# Patient Record
Sex: Female | Born: 1980 | Race: Black or African American | Hispanic: No | Marital: Married | Smoking: Never smoker
Health system: Southern US, Community
[De-identification: ages and names within clinical notes are randomized; demographics above are authoritative.]

## PROBLEM LIST (undated history)

## (undated) DIAGNOSIS — D573 Sickle-cell trait: Secondary | ICD-10-CM

## (undated) DIAGNOSIS — D649 Anemia, unspecified: Secondary | ICD-10-CM

---

## 2009-05-21 ENCOUNTER — Emergency Department (HOSPITAL_COMMUNITY): Admission: EM | Admit: 2009-05-21 | Discharge: 2009-05-21 | Payer: Self-pay | Admitting: Emergency Medicine

## 2009-06-19 ENCOUNTER — Emergency Department (HOSPITAL_BASED_OUTPATIENT_CLINIC_OR_DEPARTMENT_OTHER): Admission: EM | Admit: 2009-06-19 | Discharge: 2009-06-19 | Payer: Self-pay | Admitting: Emergency Medicine

## 2010-01-03 ENCOUNTER — Emergency Department (HOSPITAL_BASED_OUTPATIENT_CLINIC_OR_DEPARTMENT_OTHER): Admission: EM | Admit: 2010-01-03 | Discharge: 2010-01-03 | Payer: Self-pay | Admitting: Emergency Medicine

## 2010-01-03 ENCOUNTER — Ambulatory Visit: Payer: Self-pay | Admitting: Diagnostic Radiology

## 2010-04-16 ENCOUNTER — Ambulatory Visit: Payer: Self-pay | Admitting: Radiology

## 2010-04-16 ENCOUNTER — Ambulatory Visit (HOSPITAL_BASED_OUTPATIENT_CLINIC_OR_DEPARTMENT_OTHER)
Admission: RE | Admit: 2010-04-16 | Discharge: 2010-04-16 | Payer: Self-pay | Source: Home / Self Care | Admitting: Emergency Medicine

## 2010-04-16 ENCOUNTER — Emergency Department (HOSPITAL_BASED_OUTPATIENT_CLINIC_OR_DEPARTMENT_OTHER): Admission: EM | Admit: 2010-04-16 | Discharge: 2010-04-16 | Payer: Self-pay | Admitting: Emergency Medicine

## 2010-07-06 ENCOUNTER — Other Ambulatory Visit (HOSPITAL_COMMUNITY): Payer: Self-pay | Admitting: *Deleted

## 2010-07-06 DIAGNOSIS — Z3682 Encounter for antenatal screening for nuchal translucency: Secondary | ICD-10-CM

## 2010-07-27 ENCOUNTER — Other Ambulatory Visit (HOSPITAL_COMMUNITY): Payer: BC Managed Care – PPO

## 2010-07-27 ENCOUNTER — Other Ambulatory Visit (HOSPITAL_COMMUNITY): Payer: Self-pay

## 2010-07-31 LAB — WET PREP, GENITAL
Clue Cells Wet Prep HPF POC: NONE SEEN
Trich, Wet Prep: NONE SEEN

## 2010-07-31 LAB — URINALYSIS, ROUTINE W REFLEX MICROSCOPIC
Glucose, UA: NEGATIVE mg/dL
Ketones, ur: NEGATIVE mg/dL
Leukocytes, UA: NEGATIVE
Protein, ur: NEGATIVE mg/dL
Urobilinogen, UA: 0.2 mg/dL (ref 0.0–1.0)

## 2010-07-31 LAB — URINE MICROSCOPIC-ADD ON

## 2010-07-31 LAB — PREGNANCY, URINE: Preg Test, Ur: NEGATIVE

## 2010-08-03 ENCOUNTER — Ambulatory Visit (HOSPITAL_COMMUNITY)
Admission: RE | Admit: 2010-08-03 | Discharge: 2010-08-03 | Disposition: A | Discharge status: Home / Self Care | Payer: BC Managed Care – PPO | Source: Ambulatory Visit | Attending: *Deleted | Admitting: *Deleted

## 2010-08-03 ENCOUNTER — Ambulatory Visit (HOSPITAL_COMMUNITY)
Admission: RE | Admit: 2010-08-03 | Discharge: 2010-08-03 | Disposition: A | Discharge status: Home / Self Care | Payer: BC Managed Care – PPO | Source: Ambulatory Visit | Attending: Obstetrics and Gynecology | Admitting: Obstetrics and Gynecology

## 2010-08-03 DIAGNOSIS — O3510X0 Maternal care for (suspected) chromosomal abnormality in fetus, unspecified, not applicable or unspecified: Secondary | ICD-10-CM | POA: Insufficient documentation

## 2010-08-03 DIAGNOSIS — O351XX0 Maternal care for (suspected) chromosomal abnormality in fetus, not applicable or unspecified: Secondary | ICD-10-CM | POA: Insufficient documentation

## 2010-08-03 DIAGNOSIS — Z3682 Encounter for antenatal screening for nuchal translucency: Secondary | ICD-10-CM

## 2010-08-03 DIAGNOSIS — Z3689 Encounter for other specified antenatal screening: Secondary | ICD-10-CM | POA: Insufficient documentation

## 2010-08-05 LAB — CBC
MCHC: 33.1 g/dL (ref 30.0–36.0)
RBC: 4.41 MIL/uL (ref 3.87–5.11)
RDW: 12.7 % (ref 11.5–15.5)

## 2010-08-05 LAB — DIFFERENTIAL
Basophils Absolute: 0.1 10*3/uL (ref 0.0–0.1)
Basophils Relative: 2 % — ABNORMAL HIGH (ref 0–1)
Neutro Abs: 2.9 10*3/uL (ref 1.7–7.7)
Neutrophils Relative %: 60 % (ref 43–77)

## 2010-08-05 LAB — BASIC METABOLIC PANEL
CO2: 24 mEq/L (ref 19–32)
Calcium: 9.3 mg/dL (ref 8.4–10.5)
Creatinine, Ser: 0.62 mg/dL (ref 0.4–1.2)
GFR calc Af Amer: >60 mL/min (ref >60)
GFR calc non Af Amer: >60 mL/min (ref >60)
Glucose, Bld: 99 mg/dL (ref 70–99)

## 2010-08-05 LAB — POCT CARDIAC MARKERS
CKMB, poc: <1 ng/mL — ABNORMAL LOW (ref 1.0–8.0)
Myoglobin, poc: 43.6 ng/mL (ref 12–200)

## 2010-08-21 ENCOUNTER — Other Ambulatory Visit (HOSPITAL_COMMUNITY): Payer: Self-pay | Admitting: Obstetrics and Gynecology

## 2010-08-21 DIAGNOSIS — Z0489 Encounter for examination and observation for other specified reasons: Secondary | ICD-10-CM

## 2010-08-21 DIAGNOSIS — IMO0002 Reserved for concepts with insufficient information to code with codable children: Secondary | ICD-10-CM

## 2010-09-12 ENCOUNTER — Ambulatory Visit (HOSPITAL_COMMUNITY)
Admission: RE | Admit: 2010-09-12 | Discharge: 2010-09-12 | Disposition: A | Discharge status: Home / Self Care | Payer: BC Managed Care – PPO | Source: Ambulatory Visit | Attending: Obstetrics and Gynecology | Admitting: Obstetrics and Gynecology

## 2010-09-12 DIAGNOSIS — O3410 Maternal care for benign tumor of corpus uteri, unspecified trimester: Secondary | ICD-10-CM | POA: Insufficient documentation

## 2010-09-12 DIAGNOSIS — Z1389 Encounter for screening for other disorder: Secondary | ICD-10-CM | POA: Insufficient documentation

## 2010-09-12 DIAGNOSIS — Z363 Encounter for antenatal screening for malformations: Secondary | ICD-10-CM | POA: Insufficient documentation

## 2010-09-12 DIAGNOSIS — O358XX0 Maternal care for other (suspected) fetal abnormality and damage, not applicable or unspecified: Secondary | ICD-10-CM | POA: Insufficient documentation

## 2010-09-12 DIAGNOSIS — Z0489 Encounter for examination and observation for other specified reasons: Secondary | ICD-10-CM

## 2010-09-27 ENCOUNTER — Other Ambulatory Visit (HOSPITAL_COMMUNITY): Payer: Self-pay | Admitting: Obstetrics and Gynecology

## 2010-09-27 ENCOUNTER — Other Ambulatory Visit (HOSPITAL_BASED_OUTPATIENT_CLINIC_OR_DEPARTMENT_OTHER): Payer: Self-pay | Admitting: Obstetrics and Gynecology

## 2010-10-01 ENCOUNTER — Other Ambulatory Visit (HOSPITAL_COMMUNITY): Payer: Self-pay | Admitting: *Deleted

## 2010-10-01 ENCOUNTER — Other Ambulatory Visit (HOSPITAL_COMMUNITY): Payer: Self-pay | Admitting: Obstetrics and Gynecology

## 2010-10-01 DIAGNOSIS — Z0489 Encounter for examination and observation for other specified reasons: Secondary | ICD-10-CM

## 2012-02-19 ENCOUNTER — Encounter (HOSPITAL_BASED_OUTPATIENT_CLINIC_OR_DEPARTMENT_OTHER): Payer: Self-pay | Admitting: *Deleted

## 2012-02-19 ENCOUNTER — Emergency Department (HOSPITAL_BASED_OUTPATIENT_CLINIC_OR_DEPARTMENT_OTHER)
Admission: EM | Admit: 2012-02-19 | Discharge: 2012-02-19 | Disposition: A | Discharge status: Home / Self Care | Payer: Medicaid Other | Attending: Emergency Medicine | Admitting: Emergency Medicine

## 2012-02-19 ENCOUNTER — Emergency Department (HOSPITAL_BASED_OUTPATIENT_CLINIC_OR_DEPARTMENT_OTHER): Payer: Medicaid Other

## 2012-02-19 DIAGNOSIS — Z3490 Encounter for supervision of normal pregnancy, unspecified, unspecified trimester: Secondary | ICD-10-CM

## 2012-02-19 DIAGNOSIS — O209 Hemorrhage in early pregnancy, unspecified: Secondary | ICD-10-CM | POA: Insufficient documentation

## 2012-02-19 DIAGNOSIS — D509 Iron deficiency anemia, unspecified: Secondary | ICD-10-CM | POA: Insufficient documentation

## 2012-02-19 LAB — CBC WITH DIFFERENTIAL/PLATELET
Basophils Absolute: 0 10*3/uL (ref 0.0–0.1)
Eosinophils Absolute: 0.1 10*3/uL (ref 0.0–0.7)
Eosinophils Relative: 1 % (ref 0–5)
Lymphs Abs: 2.6 10*3/uL (ref 0.7–4.0)
MCH: 28.1 pg (ref 26.0–34.0)
MCHC: 36 g/dL (ref 30.0–36.0)
MCV: 77.9 fL — ABNORMAL LOW (ref 78.0–100.0)
Platelets: 195 10*3/uL (ref 150–400)
RDW: 12.3 % (ref 11.5–15.5)

## 2012-02-19 LAB — WET PREP, GENITAL

## 2012-02-19 LAB — URINE MICROSCOPIC-ADD ON

## 2012-02-19 LAB — URINALYSIS, ROUTINE W REFLEX MICROSCOPIC
Glucose, UA: NEGATIVE mg/dL
Ketones, ur: NEGATIVE mg/dL
Leukocytes, UA: NEGATIVE
Protein, ur: NEGATIVE mg/dL

## 2012-02-19 LAB — BASIC METABOLIC PANEL
Calcium: 8.9 mg/dL (ref 8.4–10.5)
GFR calc non Af Amer: >90 mL/min (ref >90)
Glucose, Bld: 90 mg/dL (ref 70–99)
Sodium: 134 mEq/L — ABNORMAL LOW (ref 135–145)

## 2012-02-19 MED ORDER — FOLIC ACID 800 MCG PO TABS: 800.0000 ug | ORAL_TABLET | Freq: Every day | ORAL | Status: DC

## 2012-02-19 NOTE — ED Notes (Signed)
Pt states 2 months preg with bright red spotting x 30 mins

## 2012-02-19 NOTE — ED Notes (Signed)
PA at bedside.

## 2012-02-19 NOTE — ED Notes (Signed)
Tia O, PA-C at bedside speaking to pt.

## 2012-02-19 NOTE — ED Notes (Signed)
Patient transported to Ultrasound 

## 2012-02-19 NOTE — ED Provider Notes (Signed)
History     CSN: [id number]  Arrival date & time 02/19/12  1554   First MD Initiated Contact with Patient 02/19/12 1610      Chief Complaint  Patient presents with  . Vaginal Bleeding    (Consider location/radiation/quality/duration/timing/severity/associated sxs/prior treatment) HPI Comments: Patient G2P1A0 presents with estimated [redacted] weeks gestation with first trimester bleeding. Patient states that around 3:30 she noticed some bright red blood blood on the toilet paper after she went to the bathroom. Patient states that there has been no spotting since. She has not had to use a pad. LMP was in early September. Patient has been taking prenatal vitamins and is receiving prenatal care [facility]. An ultrasound has not been performed.  Associated symptoms include mild back pain 5/10. Denies fever or chills. Denies NVD or abdominal pain. Denies dysuria, frequency, or urgency. Denies vaginal discharge, pelvic pain, or odor.      The history is provided by the patient. No language interpreter was used.    History reviewed. No pertinent past medical history.  History reviewed. No pertinent past surgical history.  History reviewed. No pertinent family history.  History  Substance Use Topics  . Smoking status: Never Smoker   . Smokeless tobacco: Not on file  . Alcohol Use: No    OB History    Grav Para Term Preterm Abortions TAB SAB Ect Mult Living                  Review of Systems  Constitutional: Negative for fever and chills.  HENT: Negative for neck pain.   Cardiovascular: Negative for leg swelling.  Gastrointestinal: Negative for nausea, vomiting, abdominal pain and diarrhea.       No incontinence of bowel  Genitourinary: Positive for vaginal bleeding. Negative for dysuria, urgency, vaginal discharge, difficulty urinating and vaginal pain.       No incontinence or retention  Musculoskeletal: Positive for back pain.  Skin: Negative for rash.    Neurological: Negative for weakness and numbness.    Allergies  Review of patient's allergies indicates no known allergies.  Home Medications   Current Outpatient Rx  Name Route Sig Dispense Refill  . PRENATAL MULTIVITAMIN CH Oral Take 1 tablet by mouth daily.      BP 128/65  Pulse 72  Temp 99 F (37.2 C) (Oral)  Resp 18  Ht 5\' 5"  (1.651 m)  Wt 175 lb (79.379 kg)  BMI 29.12 kg/m2  SpO2 100%  LMP 12/20/2011  Physical Exam  Nursing note and vitals reviewed. Constitutional: She appears well-developed and well-nourished.  HENT:  Head: Normocephalic and atraumatic.  Mouth/Throat: Oropharynx is clear and moist.  Eyes: Conjunctivae normal and EOM are normal.  Neck: Normal range of motion. Neck supple.  Cardiovascular: Normal rate, regular rhythm and normal heart sounds.   Pulmonary/Chest: Effort normal and breath sounds normal.  Abdominal: Soft. Bowel sounds are normal.  Genitourinary: Uterus normal. Cervix exhibits no motion tenderness, no discharge and no friability.    Neurological: She is alert.  Skin: Skin is warm and dry.    ED Course  Procedures (including critical care time)   Labs Reviewed  CBC WITH DIFFERENTIAL  BASIC METABOLIC PANEL  ABO/RH  URINALYSIS, ROUTINE W REFLEX MICROSCOPIC  WET PREP, GENITAL  GC/CHLAMYDIA PROBE AMP, GENITAL   Results for orders placed during the hospital encounter of 02/19/12  CBC WITH DIFFERENTIAL      Component Value Range   WBC 7.4  4.0 - 10.5 K/uL  RBC 3.99  3.87 - 5.11 MIL/uL   Hemoglobin 11.2 (*) 12.0 - 15.0 g/dL   HCT 16.1 (*) 09.6 - 04.5 %   MCV 77.9 (*) 78.0 - 100.0 fL   MCH 28.1  26.0 - 34.0 pg   MCHC 36.0  30.0 - 36.0 g/dL   RDW 40.9  81.1 - 91.4 %   Platelets 195  150 - 400 K/uL   Neutrophils Relative 54  43 - 77 %   Neutro Abs 4.0  1.7 - 7.7 K/uL   Lymphocytes Relative 36  12 - 46 %   Lymphs Abs 2.6  0.7 - 4.0 K/uL   Monocytes Relative 9  3 - 12 %   Monocytes Absolute 0.7  0.1 - 1.0 K/uL    Eosinophils Relative 1  0 - 5 %   Eosinophils Absolute 0.1  0.0 - 0.7 K/uL   Basophils Relative 0  0 - 1 %   Basophils Absolute 0.0  0.0 - 0.1 K/uL  BASIC METABOLIC PANEL      Component Value Range   Sodium 134 (*) 135 - 145 mEq/L   Potassium 3.5  3.5 - 5.1 mEq/L   Chloride 101  96 - 112 mEq/L   CO2 22  19 - 32 mEq/L   Glucose, Bld 90  70 - 99 mg/dL   BUN 10  6 - 23 mg/dL   Creatinine, Ser 7.82  0.50 - 1.10 mg/dL   Calcium 8.9  8.4 - 95.6 mg/dL   GFR calc non Af Amer >90  >90 mL/min   GFR calc Af Amer >90  >90 mL/min  URINALYSIS, ROUTINE W REFLEX MICROSCOPIC      Component Value Range   Color, Urine YELLOW  YELLOW   APPearance CLEAR  CLEAR   Specific Gravity, Urine 1.012  1.005 - 1.030   pH 7.0  5.0 - 8.0   Glucose, UA NEGATIVE  NEGATIVE mg/dL   Hgb urine dipstick LARGE (*) NEGATIVE   Bilirubin Urine NEGATIVE  NEGATIVE   Ketones, ur NEGATIVE  NEGATIVE mg/dL   Protein, ur NEGATIVE  NEGATIVE mg/dL   Urobilinogen, UA 0.2  0.0 - 1.0 mg/dL   Nitrite NEGATIVE  NEGATIVE   Leukocytes, UA NEGATIVE  NEGATIVE  WET PREP, GENITAL      Component Value Range   Yeast Wet Prep HPF POC NONE SEEN  NONE SEEN   Trich, Wet Prep NONE SEEN  NONE SEEN   Clue Cells Wet Prep HPF POC NONE SEEN  NONE SEEN   WBC, Wet Prep HPF POC RARE (*) NONE SEEN  URINE MICROSCOPIC-ADD ON      Component Value Range   Squamous Epithelial / LPF RARE  RARE   WBC, UA 0-2  <3 WBC/hpf   RBC / HPF 0-2  <3 RBC/hpf   Bacteria, UA FEW (*) RARE   Urine-Other MUCOUS PRESENT      US Ob Comp Less 14 Wks  02/19/2012  *RADIOLOGY REPORT*  Clinical Data: 31 year old pregnant female with vaginal bleeding and pain.  Estimated gestation of 8 weeks 5 days by LMP.  OBSTETRIC <14 WK [place] AND TRANSVAGINAL OB US  Technique:  Both transabdominal and transvaginal ultrasound examinations were performed for complete evaluation of the gestation as well as the maternal uterus, adnexal regions, and pelvic cul-de-sac.  Transvaginal technique was  performed to assess early pregnancy.  Comparison:  None.  Intrauterine gestational sac:  Visualized/normal in shape. Yolk sac: Present Embryo: Present Cardiac Activity: Present Heart Rate: 168  bpm  CRL: 32.9  mm  10 w  1 d            [place] EDC: 09/15/2012  Maternal uterus/adnexae: There is no evidence of subchorionic hemorrhage. The ovaries bilaterally are unremarkable. There is no evidence of free fluid or adnexal mass.  IMPRESSION: Single living intrauterine gestation with estimated gestational age of [redacted] weeks 1 day by this ultrasound.  No evidence of subchorionic hemorrhage.   Original Report Authenticated By: Rosendo Gros, M.D.    US Ob Comp Less 14 Wks  02/19/2012  *RADIOLOGY REPORT*  Clinical Data: 31 year old pregnant female with vaginal bleeding and pain.  Estimated gestation of 8 weeks 5 days by LMP.  OBSTETRIC <14 WK [place] AND TRANSVAGINAL OB US  Technique:  Both transabdominal and transvaginal ultrasound examinations were performed for complete evaluation of the gestation as well as the maternal uterus, adnexal regions, and pelvic cul-de-sac.  Transvaginal technique was performed to assess early pregnancy.  Comparison:  None.  Intrauterine gestational sac:  Visualized/normal in shape. Yolk sac: Present Embryo: Present Cardiac Activity: Present Heart Rate: 168 bpm  CRL: 32.9  mm  10 w  1 d            [place] EDC: 09/15/2012  Maternal uterus/adnexae: There is no evidence of subchorionic hemorrhage. The ovaries bilaterally are unremarkable. There is no evidence of free fluid or adnexal mass.  IMPRESSION: Single living intrauterine gestation with estimated gestational age of [redacted] weeks 1 day by this ultrasound.  No evidence of subchorionic hemorrhage.   Original Report Authenticated By: Rosendo Gros, M.D.      1. First trimester bleeding   2. Pregnancy   3. Microcytic anemia       MDM  Patient G2P1A0, with adequate prenatal care, presented with one episode of first trimester bleeding. Cervical os closed  on pelvic. No CMT. Wet prep, UA, BMP: unremarkable. ABO/Rh: B pos CBC: remarkable for microcytic anemia. US Ob complete & US transvaginal: remarkable for viable pregnancy, [redacted] weeks gestation. Patient will follow-up with OB/GYN at local health department. Patient discharged with Rx for supplemental folic acid, as she has not been taking folic acid which conflicts with current USPTF guidelines. Patient also informed of anemia and encouraged to follow-up with OB/GYN for recheck and supplemental iron if needed. Return precautions given verbally and in discharge summary. No red flags for missed or threatened abortion, as patient has closed cervical os, has not expelled products of conception, and viable pregnancy confirmed by imaging.        Pixie Casino, PA-C 02/19/12 2014

## 2012-02-20 LAB — GC/CHLAMYDIA PROBE AMP, GENITAL
Chlamydia, DNA Probe: NEGATIVE
GC Probe Amp, Genital: NEGATIVE

## 2012-02-25 NOTE — ED Provider Notes (Signed)
Medical screening examination/treatment/procedure(s) were performed by non-physician practitioner and as supervising physician I was immediately available for consultation/collaboration.  Jones Skene, M.D.     Jones Skene, MD 02/25/12 [phone]

## 2012-06-11 ENCOUNTER — Emergency Department (HOSPITAL_BASED_OUTPATIENT_CLINIC_OR_DEPARTMENT_OTHER)
Admission: EM | Admit: 2012-06-11 | Discharge: 2012-06-11 | Disposition: A | Discharge status: Home / Self Care | Payer: 59 | Attending: Emergency Medicine | Admitting: Emergency Medicine

## 2012-06-11 ENCOUNTER — Encounter (HOSPITAL_BASED_OUTPATIENT_CLINIC_OR_DEPARTMENT_OTHER): Payer: Self-pay | Admitting: *Deleted

## 2012-06-11 DIAGNOSIS — J069 Acute upper respiratory infection, unspecified: Secondary | ICD-10-CM | POA: Insufficient documentation

## 2012-06-11 DIAGNOSIS — O9989 Other specified diseases and conditions complicating pregnancy, childbirth and the puerperium: Secondary | ICD-10-CM | POA: Insufficient documentation

## 2012-06-11 DIAGNOSIS — R51 Headache: Secondary | ICD-10-CM | POA: Insufficient documentation

## 2012-06-11 NOTE — ED Provider Notes (Signed)
Medical screening examination/treatment/procedure(s) were performed by non-physician practitioner and as supervising physician I was immediately available for consultation/collaboration.   Shaquayla Klimas, MD 06/11/12 2334 

## 2012-06-11 NOTE — ED Notes (Signed)
Reports ob Md suggested pt come to ed to be evaluated, pt is 6 month pregnant, last  Saw ob Md 1 week ago, baby moving as expectant, mother reports no decrease in activity

## 2012-06-11 NOTE — ED Provider Notes (Signed)
History     CSN: [id number]  Arrival date & time 06/11/12  2013   First MD Initiated Contact with Patient 06/11/12 2104      Chief Complaint  Patient presents with  . URI    (Consider location/radiation/quality/duration/timing/severity/associated sxs/prior treatment) Patient is a 32 y.o. female presenting with URI. The history is provided by the patient. No language interpreter was used.  URI The primary symptoms include headaches and cough. Primary symptoms do not include fever. The current episode started 6 to 7 days ago. This is a new problem. The problem has not changed since onset. Symptoms associated with the illness include congestion.    History reviewed. No pertinent past medical history.  History reviewed. No pertinent past surgical history.  History reviewed. No pertinent family history.  History  Substance Use Topics  . Smoking status: Never Smoker   . Smokeless tobacco: Not on file  . Alcohol Use: No    OB History    Grav Para Term Preterm Abortions TAB SAB Ect Mult Living                  Review of Systems  Constitutional: Negative for fever.  HENT: Positive for congestion.   Respiratory: Positive for cough.   Neurological: Positive for headaches.    Allergies  Review of patient's allergies indicates no known allergies.  Home Medications   Current Outpatient Rx  Name  Route  Sig  Dispense  Refill  . FOLIC ACID 800 MCG PO TABS   Oral   Take 1 tablet (800 mcg total) by mouth daily.   30 tablet   2   . PRENATAL MULTIVITAMIN CH   Oral   Take 1 tablet by mouth daily.           BP 123/59  Pulse 76  Temp 97.9 F (36.6 C) (Oral)  Resp 16  Ht 5\' 5"  (1.651 m)  Wt 180 lb (81.647 kg)  BMI 29.95 kg/m2  SpO2 100%  LMP 01/10/2012  Physical Exam  Nursing note and vitals reviewed. Constitutional: She appears well-developed and well-nourished.  HENT:  Head: Normocephalic and atraumatic.  Right Ear: External ear normal.  Left Ear:  External ear normal.  Nose: Rhinorrhea present.  Mouth/Throat: Posterior oropharyngeal erythema present.  Eyes: Conjunctivae normal and EOM are normal.  Cardiovascular: Regular rhythm.   Pulmonary/Chest: Effort normal and breath sounds normal.  Musculoskeletal: Normal range of motion.    ED Course  Procedures (including critical care time)  Labs Reviewed - No data to display No results found.   1. URI (upper respiratory infection)       MDM  Pt instructed on otc medication:pt has not had fever:doubt flu        Teressa Lower, NP 06/11/12 2207

## 2012-06-11 NOTE — ED Notes (Signed)
Pt c/o URI symptoms x 1 week pt is 6 months preg

## 2013-08-29 IMAGING — US US OB COMP LESS 14 WK
1 series · 14 of 28 positions shown · non-contrast
Comparison: None.

CLINICAL DATA: 31-year-old pregnant female with vaginal bleeding
and pain.  Estimated gestation of 8 weeks 5 days by LMP.

OBSTETRIC <14 WK US AND TRANSVAGINAL OB US
TECHNIQUE: Both transabdominal and transvaginal ultrasound
examinations were performed for complete evaluation of the
gestation as well as the maternal uterus, adnexal regions, and
pelvic cul-de-sac.  Transvaginal technique was performed to assess
early pregnancy.

[Series 1: us ob comp less 14 wk · 0.26mm/px · 14 of 53 slices shown]
[im 2/53]
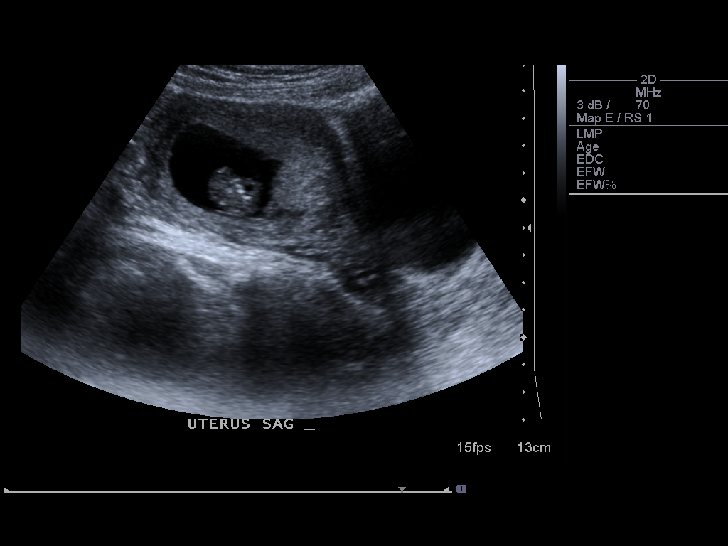
[im 6/53]
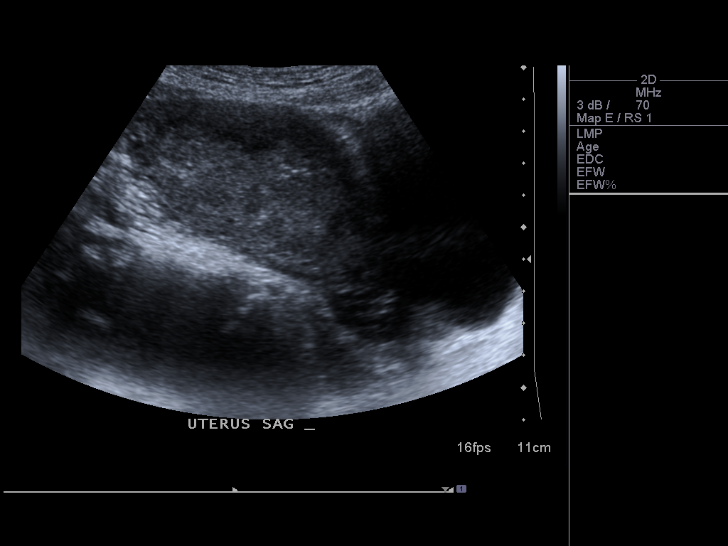
[im 10/53]
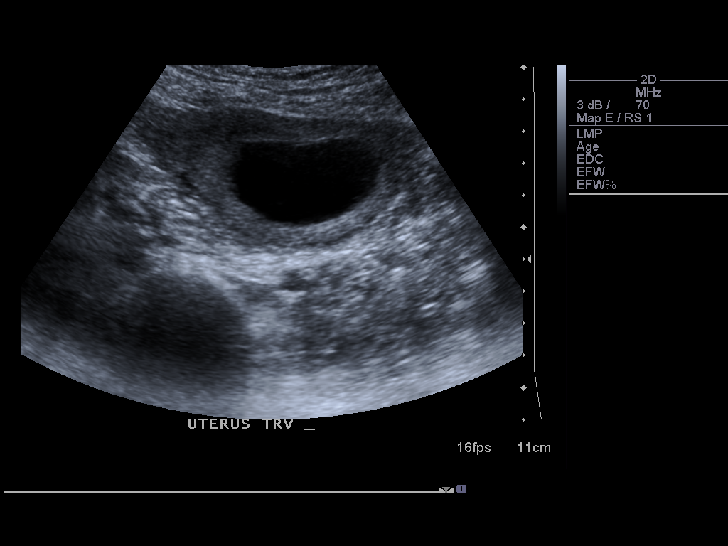
[im 14/53]
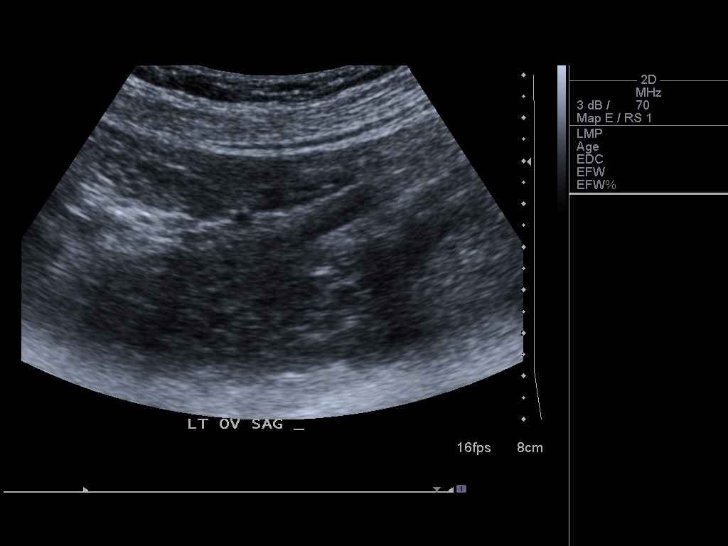
[im 18/53]
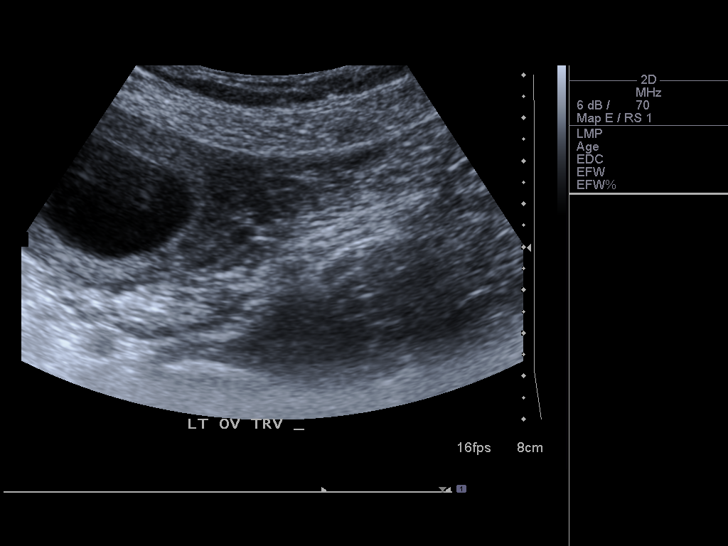
[im 22/53]
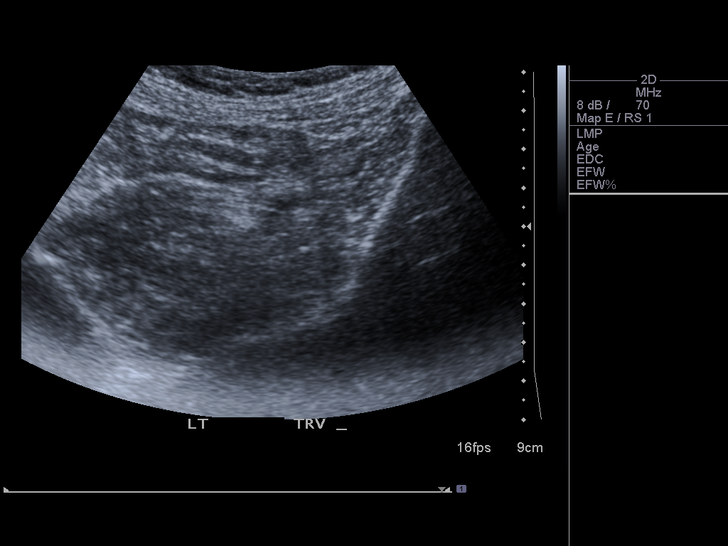
[im 26/53]
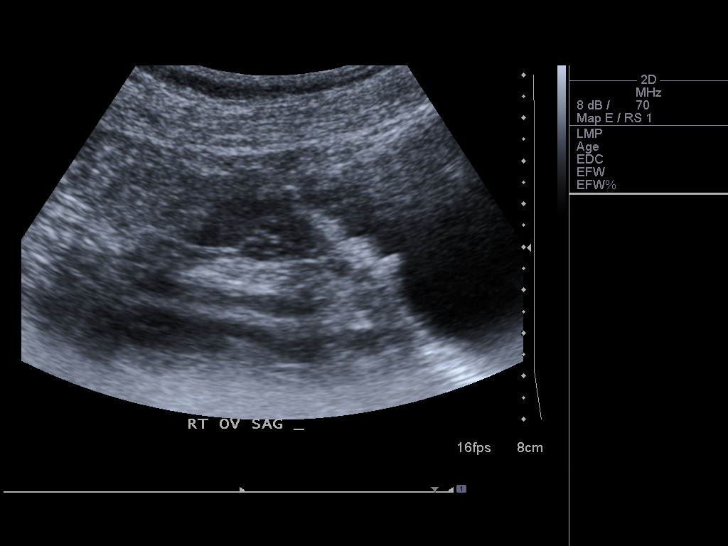
[im 29/53]
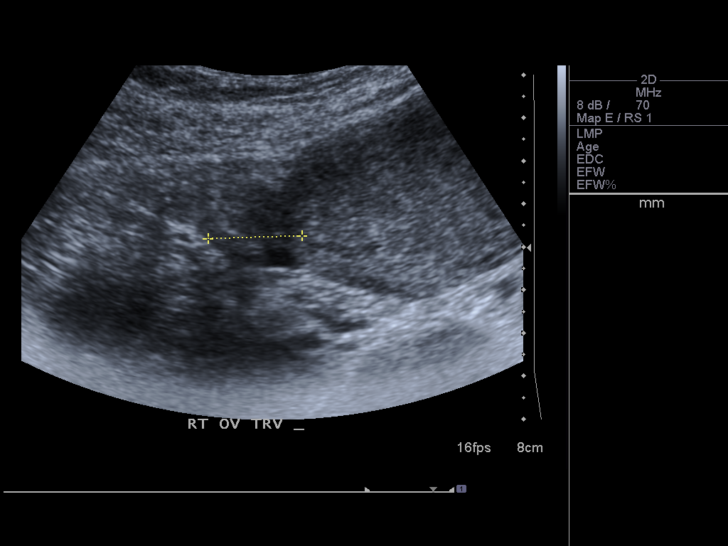
[im 33/53]
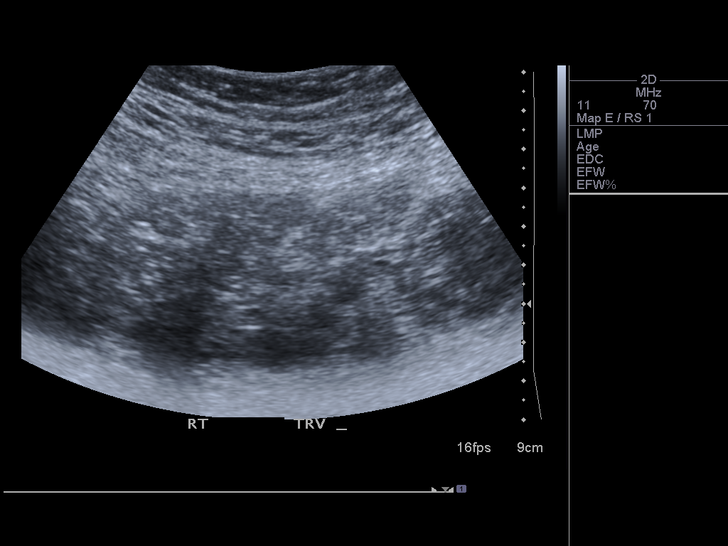
[im 37/53]
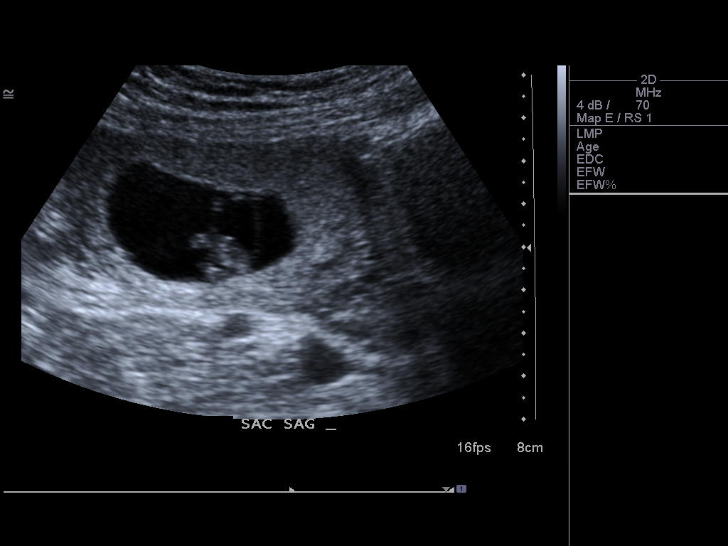
[im 41/53]
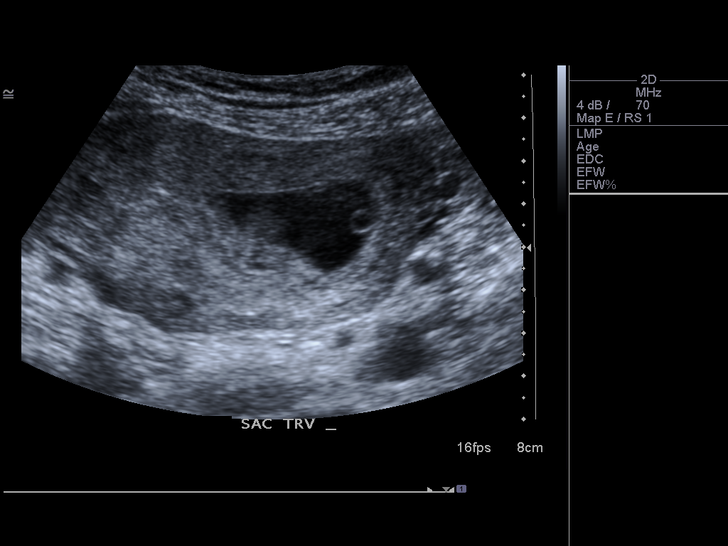
[im 45/53]
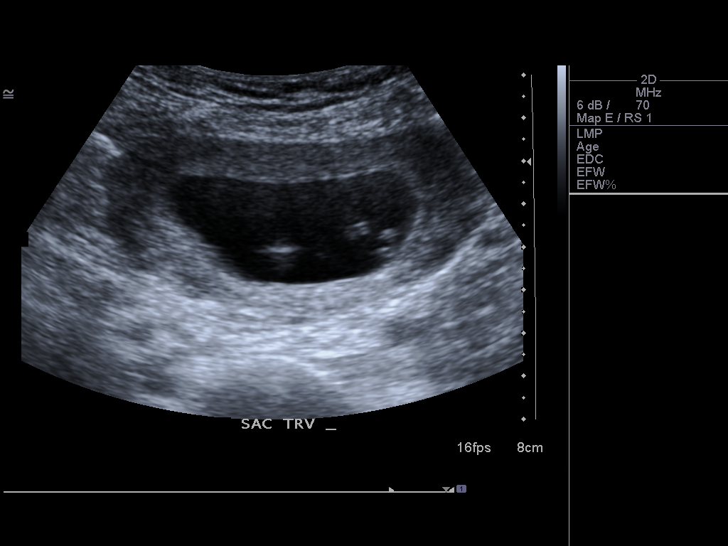
[im 49/53]
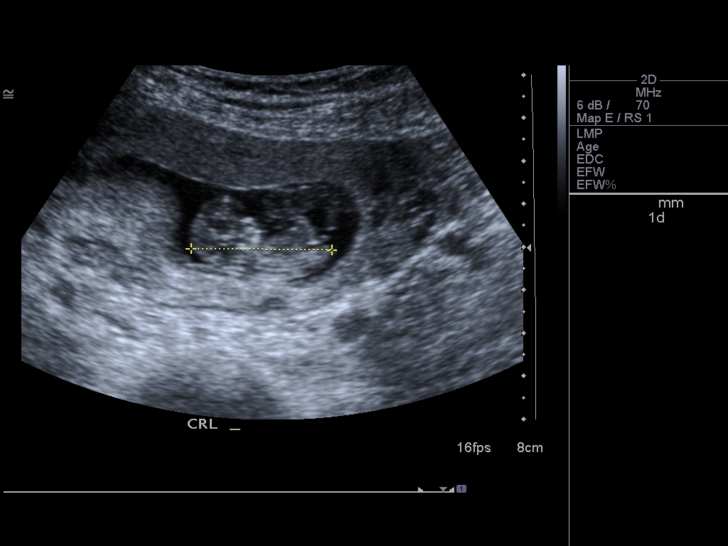
[im 53/53]
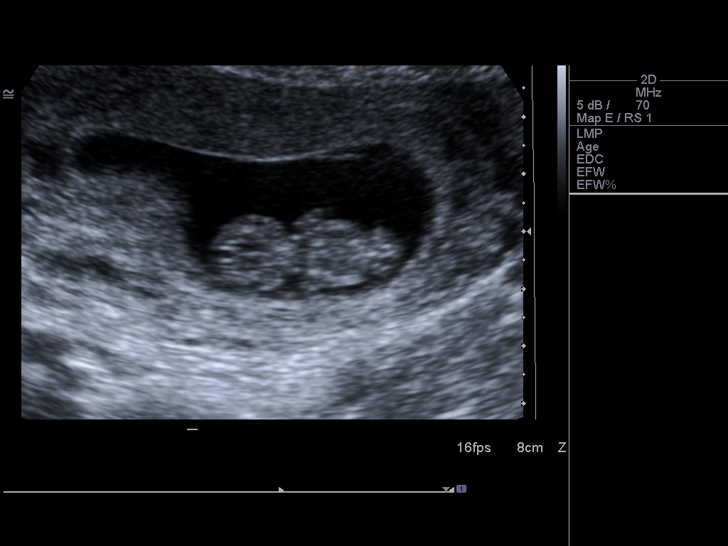

[14 of 28 positions shown; findings below may reference images not displayed]

Intrauterine gestational sac:  Visualized/normal in shape.
Yolk sac: Present
Embryo: Present
Cardiac Activity: Present
Heart Rate: 168 bpm

CRL: 32.9  mm  10 w  1 d            US EDC: 09/15/2012

Maternal uterus/adnexae:
There is no evidence of subchorionic hemorrhage.
The ovaries bilaterally are unremarkable.
There is no evidence of free fluid or adnexal mass.
IMPRESSION: Single living intrauterine gestation with estimated gestational age
of 10 weeks 1 day by this ultrasound.

No evidence of subchorionic hemorrhage.

## 2013-11-16 ENCOUNTER — Encounter (HOSPITAL_BASED_OUTPATIENT_CLINIC_OR_DEPARTMENT_OTHER): Payer: Self-pay | Admitting: Emergency Medicine

## 2013-11-16 ENCOUNTER — Emergency Department (HOSPITAL_BASED_OUTPATIENT_CLINIC_OR_DEPARTMENT_OTHER): Payer: 59

## 2013-11-16 ENCOUNTER — Emergency Department (HOSPITAL_BASED_OUTPATIENT_CLINIC_OR_DEPARTMENT_OTHER)
Admission: EM | Admit: 2013-11-16 | Discharge: 2013-11-16 | Disposition: A | Discharge status: Home / Self Care | Payer: 59 | Attending: Emergency Medicine | Admitting: Emergency Medicine

## 2013-11-16 DIAGNOSIS — Z79899 Other long term (current) drug therapy: Secondary | ICD-10-CM | POA: Insufficient documentation

## 2013-11-16 DIAGNOSIS — R112 Nausea with vomiting, unspecified: Secondary | ICD-10-CM | POA: Insufficient documentation

## 2013-11-16 DIAGNOSIS — Z3202 Encounter for pregnancy test, result negative: Secondary | ICD-10-CM | POA: Insufficient documentation

## 2013-11-16 DIAGNOSIS — R1013 Epigastric pain: Secondary | ICD-10-CM | POA: Insufficient documentation

## 2013-11-16 LAB — URINALYSIS, ROUTINE W REFLEX MICROSCOPIC
BILIRUBIN URINE: NEGATIVE
Glucose, UA: NEGATIVE mg/dL
Ketones, ur: NEGATIVE mg/dL
LEUKOCYTES UA: NEGATIVE
NITRITE: NEGATIVE
PH: 5.5 (ref 5.0–8.0)
Protein, ur: 30 mg/dL — AB
SPECIFIC GRAVITY, URINE: 1.028 (ref 1.005–1.030)
UROBILINOGEN UA: 0.2 mg/dL (ref 0.0–1.0)

## 2013-11-16 LAB — COMPREHENSIVE METABOLIC PANEL
ALT: 12 U/L (ref 0–35)
AST: 16 U/L (ref 0–37)
Albumin: 4.4 g/dL (ref 3.5–5.2)
Alkaline Phosphatase: 62 U/L (ref 39–117)
BILIRUBIN TOTAL: 0.3 mg/dL (ref 0.3–1.2)
BUN: 9 mg/dL (ref 6–23)
CALCIUM: 9.5 mg/dL (ref 8.4–10.5)
CHLORIDE: 100 meq/L (ref 96–112)
CO2: 22 meq/L (ref 19–32)
CREATININE: 0.6 mg/dL (ref 0.50–1.10)
GLUCOSE: 147 mg/dL — AB (ref 70–99)
Potassium: 4 mEq/L (ref 3.7–5.3)
Sodium: 137 mEq/L (ref 137–147)
Total Protein: 8.8 g/dL — ABNORMAL HIGH (ref 6.0–8.3)

## 2013-11-16 LAB — URINE MICROSCOPIC-ADD ON

## 2013-11-16 LAB — CBC WITH DIFFERENTIAL/PLATELET
BASOS PCT: 0 % (ref 0–1)
Basophils Absolute: 0 10*3/uL (ref 0.0–0.1)
EOS ABS: 0 10*3/uL (ref 0.0–0.7)
Eosinophils Relative: 1 % (ref 0–5)
HCT: 38.5 % (ref 36.0–46.0)
HEMOGLOBIN: 13.3 g/dL (ref 12.0–15.0)
LYMPHS ABS: 1.2 10*3/uL (ref 0.7–4.0)
Lymphocytes Relative: 20 % (ref 12–46)
MCH: 27.6 pg (ref 26.0–34.0)
MCHC: 34.5 g/dL (ref 30.0–36.0)
MCV: 79.9 fL (ref 78.0–100.0)
MONOS PCT: 4 % (ref 3–12)
Monocytes Absolute: 0.2 10*3/uL (ref 0.1–1.0)
NEUTROS ABS: 4.3 10*3/uL (ref 1.7–7.7)
NEUTROS PCT: 75 % (ref 43–77)
PLATELETS: 207 10*3/uL (ref 150–400)
RBC: 4.82 MIL/uL (ref 3.87–5.11)
RDW: 12.4 % (ref 11.5–15.5)
WBC: 5.7 10*3/uL (ref 4.0–10.5)

## 2013-11-16 LAB — LIPASE, BLOOD: Lipase: 31 U/L (ref 11–59)

## 2013-11-16 LAB — PREGNANCY, URINE: PREG TEST UR: NEGATIVE

## 2013-11-16 MED ORDER — PANTOPRAZOLE SODIUM 40 MG IV SOLR
40.0000 mg | Freq: Once | INTRAVENOUS | Status: AC
  Administered 2013-11-16: 40 mg via INTRAVENOUS
  Filled 2013-11-16: qty 40

## 2013-11-16 MED ORDER — SODIUM CHLORIDE 0.9 % IV BOLUS (SEPSIS)
250.0000 mL | Freq: Once | INTRAVENOUS | Status: AC
  Administered 2013-11-16: 250 mL via INTRAVENOUS

## 2013-11-16 MED ORDER — HYDROCODONE-ACETAMINOPHEN 5-325 MG PO TABS: 1.0000 | ORAL_TABLET | Freq: Four times a day (QID) | ORAL | Status: DC | PRN

## 2013-11-16 MED ORDER — IOHEXOL 300 MG/ML  SOLN
100.0000 mL | Freq: Once | INTRAMUSCULAR | Status: AC | PRN
  Administered 2013-11-16: 100 mL via INTRAVENOUS

## 2013-11-16 MED ORDER — ONDANSETRON HCL 4 MG/2ML IJ SOLN
4.0000 mg | Freq: Once | INTRAMUSCULAR | Status: AC
  Administered 2013-11-16: 4 mg via INTRAVENOUS
  Filled 2013-11-16: qty 2

## 2013-11-16 MED ORDER — HYDROMORPHONE HCL PF 1 MG/ML IJ SOLN
1.0000 mg | Freq: Once | INTRAMUSCULAR | Status: AC
  Administered 2013-11-16: 1 mg via INTRAVENOUS
  Filled 2013-11-16: qty 1

## 2013-11-16 MED ORDER — SODIUM CHLORIDE 0.9 % IV SOLN: INTRAVENOUS | Status: DC

## 2013-11-16 MED ORDER — FAMOTIDINE 20 MG PO TABS: 20.0000 mg | ORAL_TABLET | Freq: Two times a day (BID) | ORAL | Status: DC

## 2013-11-16 MED ORDER — PROMETHAZINE HCL 25 MG PO TABS: 25.0000 mg | ORAL_TABLET | Freq: Four times a day (QID) | ORAL | Status: DC | PRN

## 2013-11-16 MED ORDER — IOHEXOL 300 MG/ML  SOLN
50.0000 mL | Freq: Once | INTRAMUSCULAR | Status: AC | PRN
  Administered 2013-11-16: 50 mL via ORAL

## 2013-11-16 NOTE — ED Notes (Signed)
Pt returned from CT °

## 2013-11-16 NOTE — ED Notes (Signed)
Pt c/o upper, middle abdominal pain thatbegan last night . Pt c/o some N/V but not diarrhea. She reports no relief from ibuprofen

## 2013-11-16 NOTE — ED Notes (Signed)
Pt given phone to call for a ride home. Pt allowed to wait in room until ride arrives.

## 2013-11-16 NOTE — ED Notes (Signed)
MD at bedside. 

## 2013-11-16 NOTE — ED Notes (Signed)
Patient transported to CT 

## 2013-11-16 NOTE — Discharge Instructions (Signed)
Take the Pepcid as directed for the next 2 weeks. If that helps you can continue this it is available over-the-counter. Take the Phenergan as needed for nausea and vomiting. Take the hydrocodone as needed for pain. Return for any new or worse symptoms. Today's workup without any significant findings. CAT scan was negative. Lab work all normal.

## 2013-11-16 NOTE — ED Provider Notes (Signed)
CSN: [id number]     Arrival date & time 11/16/13  [id number] History   First MD Initiated Contact with Patient 11/16/13 0730     Chief Complaint  Patient presents with  . Abdominal Pain     (Consider location/radiation/quality/duration/timing/severity/associated sxs/prior Treatment) Patient is a 33 y.o. female presenting with abdominal pain. The history is provided by the patient.  Abdominal Pain Associated symptoms: nausea and vomiting   Associated symptoms: no chest pain, no diarrhea, no dysuria, no fever and no shortness of breath    with onset of epigastric abdominal pain at the 10 PM last evening. It has been constant. Associated with nausea and vomiting but no diarrhea. No blood in the vomit. No history of any similar pain not made worse or better by anything. Pain is described as sharp ache. Patient tried ibuprofen with no relief.  History reviewed. No pertinent past medical history. History reviewed. No pertinent past surgical history. No family history on file. History  Substance Use Topics  . Smoking status: Never Smoker   . Smokeless tobacco: Not on file  . Alcohol Use: No   OB History   Grav Para Term Preterm Abortions TAB SAB Ect Mult Living                 Review of Systems  Constitutional: Negative for fever.  HENT: Negative for congestion.   Eyes: Negative for visual disturbance.  Respiratory: Negative for shortness of breath.   Cardiovascular: Negative for chest pain.  Gastrointestinal: Positive for nausea, vomiting and abdominal pain. Negative for diarrhea.  Genitourinary: Negative for dysuria and flank pain.  Musculoskeletal: Negative for back pain.  Skin: Negative for rash.  Neurological: Negative for headaches.  Hematological: Does not bruise/bleed easily.  Psychiatric/Behavioral: Negative for confusion.      Allergies  Review of patient's allergies indicates no known allergies.  Home Medications   Prior to Admission medications   Medication Sig  Start Date End Date Taking? Authorizing Provider  famotidine (PEPCID) 20 MG tablet Take 1 tablet (20 mg total) by mouth 2 (two) times daily. 11/16/13   Vanetta MuldersScott Zackowski, MD  folic acid (FOLVITE) 800 MCG tablet Take 1 tablet (800 mcg total) by mouth daily. 02/19/12   Pixie Casinoia Oliveri, PA-C  HYDROcodone-acetaminophen (NORCO/VICODIN) 5-325 MG per tablet Take 1-2 tablets by mouth every 6 (six) hours as needed for moderate pain. 11/16/13   Vanetta MuldersScott Zackowski, MD  Prenatal Vit-Fe Fumarate-FA (PRENATAL MULTIVITAMIN) TABS Take 1 tablet by mouth daily.    Historical Provider, MD  promethazine (PHENERGAN) 25 MG tablet Take 1 tablet (25 mg total) by mouth every 6 (six) hours as needed for nausea or vomiting. 11/16/13   Vanetta MuldersScott Zackowski, MD   BP 113/79  Pulse 78  Temp(Src) 98.6 F (37 C) (Oral)  Resp 20  Ht 5\' 5"  (1.651 m)  Wt 170 lb (77.111 kg)  BMI 28.29 kg/m2  LMP 10/29/2013 Physical Exam  Nursing note and vitals reviewed. Constitutional: She is oriented to person, place, and time. She appears well-developed and well-nourished. No distress.  HENT:  Head: Normocephalic and atraumatic.  Mouth/Throat: Oropharynx is clear and moist.  Eyes: Conjunctivae and EOM are normal. Pupils are equal, round, and reactive to light.  Neck: Normal range of motion.  Cardiovascular: Normal rate, regular rhythm and normal heart sounds.   Pulmonary/Chest: Effort normal and breath sounds normal. No respiratory distress.  Abdominal: Soft. Bowel sounds are normal. There is no tenderness.  Musculoskeletal: Normal range of motion.  Neurological: She is alert  and oriented to person, place, and time. No cranial nerve deficit. She exhibits normal muscle tone. Coordination normal.  Skin: Skin is warm. No rash noted.    ED Course  Procedures (including critical care time) Labs Review Labs Reviewed  URINALYSIS, ROUTINE W REFLEX MICROSCOPIC - Abnormal; Notable for the following:    Hgb urine dipstick TRACE (*)    Protein, ur 30 (*)     All other components within normal limits  URINE MICROSCOPIC-ADD ON - Abnormal; Notable for the following:    Squamous Epithelial / LPF FEW (*)    Bacteria, UA FEW (*)    All other components within normal limits  COMPREHENSIVE METABOLIC PANEL - Abnormal; Notable for the following:    Glucose, Bld 147 (*)    Total Protein 8.8 (*)    All other components within normal limits  PREGNANCY, URINE  LIPASE, BLOOD  CBC WITH DIFFERENTIAL   Results for orders placed during the hospital encounter of 11/16/13  URINALYSIS, ROUTINE W REFLEX MICROSCOPIC      Result Value Ref Range   Color, Urine YELLOW  YELLOW   APPearance CLEAR  CLEAR   Specific Gravity, Urine 1.028  1.005 - 1.030   pH 5.5  5.0 - 8.0   Glucose, UA NEGATIVE  NEGATIVE mg/dL   Hgb urine dipstick TRACE (*) NEGATIVE   Bilirubin Urine NEGATIVE  NEGATIVE   Ketones, ur NEGATIVE  NEGATIVE mg/dL   Protein, ur 30 (*) NEGATIVE mg/dL   Urobilinogen, UA 0.2  0.0 - 1.0 mg/dL   Nitrite NEGATIVE  NEGATIVE   Leukocytes, UA NEGATIVE  NEGATIVE  PREGNANCY, URINE      Result Value Ref Range   Preg Test, Ur NEGATIVE  NEGATIVE  URINE MICROSCOPIC-ADD ON      Result Value Ref Range   Squamous Epithelial / LPF FEW (*) RARE   WBC, UA 0-2  <3 WBC/hpf   RBC / HPF 0-2  <3 RBC/hpf   Bacteria, UA FEW (*) RARE  LIPASE, BLOOD      Result Value Ref Range   Lipase 31  11 - 59 U/L  COMPREHENSIVE METABOLIC PANEL      Result Value Ref Range   Sodium 137  137 - 147 mEq/L   Potassium 4.0  3.7 - 5.3 mEq/L   Chloride 100  96 - 112 mEq/L   CO2 22  19 - 32 mEq/L   Glucose, Bld 147 (*) 70 - 99 mg/dL   BUN 9  6 - 23 mg/dL   Creatinine, Ser 9.140.60  0.50 - 1.10 mg/dL   Calcium 9.5  8.4 - 78.210.5 mg/dL   Total Protein 8.8 (*) 6.0 - 8.3 g/dL   Albumin 4.4  3.5 - 5.2 g/dL   AST 16  0 - 37 U/L   ALT 12  0 - 35 U/L   Alkaline Phosphatase 62  39 - 117 U/L   Total Bilirubin 0.3  0.3 - 1.2 mg/dL   GFR calc non Af Amer >90  >90 mL/min   GFR calc Af Amer >90  >90 mL/min   CBC WITH DIFFERENTIAL      Result Value Ref Range   WBC 5.7  4.0 - 10.5 K/uL   RBC 4.82  3.87 - 5.11 MIL/uL   Hemoglobin 13.3  12.0 - 15.0 g/dL   HCT 95.638.5  21.336.0 - 08.646.0 %   MCV 79.9  78.0 - 100.0 fL   MCH 27.6  26.0 - 34.0 pg   MCHC 34.5  30.0 - 36.0 g/dL   RDW 16.1  09.6 - 04.5 %   Platelets 207  150 - 400 K/uL   Neutrophils Relative % 75  43 - 77 %   Neutro Abs 4.3  1.7 - 7.7 K/uL   Lymphocytes Relative 20  12 - 46 %   Lymphs Abs 1.2  0.7 - 4.0 K/uL   Monocytes Relative 4  3 - 12 %   Monocytes Absolute 0.2  0.1 - 1.0 K/uL   Eosinophils Relative 1  0 - 5 %   Eosinophils Absolute 0.0  0.0 - 0.7 K/uL   Basophils Relative 0  0 - 1 %   Basophils Absolute 0.0  0.0 - 0.1 K/uL     Imaging Review Dg Chest 2 View  11/16/2013   CLINICAL DATA:  Abdominal pain  EXAM: CHEST  2 VIEW  COMPARISON:  05/21/2009  FINDINGS: Prominent heart size which is increased from the prior study. Vascularity normal. Lungs are clear without infiltrate edema or effusion.  IMPRESSION: No active cardiopulmonary disease.  Heart size upper normal.   Electronically Signed   By: Marlan Palau M.D.   On: 11/16/2013 08:13   Ct Abdomen Pelvis W Contrast  11/16/2013   CLINICAL DATA:  ABDOMINAL PAIN  EXAM: CT ABDOMEN AND PELVIS WITH CONTRAST  TECHNIQUE: Multidetector CT imaging of the abdomen and pelvis was performed using the standard protocol following bolus administration of intravenous contrast.  CONTRAST:  50mL OMNIPAQUE IOHEXOL 300 MG/ML SOLN, OMNIPAQUE IOHEXOL 300 MG/ML SOLN  COMPARISON:  None.  FINDINGS: Lung bases negative.  The liver, spleen, adrenals, pancreas, kidneys are unremarkable.  The celiac, SMA, IMA, portal vein, SMV are opacified. No abdominal aortic aneurysm.  No abdominal or pelvic masses, free fluid, loculated fluid collections, nor adenopathy.  There is mild diverticulosis within the sigmoid colon without diverticulitis. Otherwise the bowel, appendix, and gallbladder fossa are negative. Moderate  amount of stool within the colon.  A small umbilical hernia containing fat. A nonobstructed, non inflamed loop of bowel is adjacent to the hernia. No inguinal hernia.  No aggressive appearing osseous lesions.  IMPRESSION: Small umbilical hernia. Otherwise no evidence of abdominal or pelvic pathology. No CT evidence accounting for the patient's clinical presentation.   Electronically Signed   By: Salome Holmes M.D.   On: 11/16/2013 08:14     EKG Interpretation None      MDM   Final diagnoses:  Epigastric abdominal pain    Patient with onset of epigastric abdominal pain at the 10 in the evening yesterday. Pain has been persistent. Associated with some nausea and vomiting but no blood in the vomit. No diarrhea. Patient's abdomen was soft and nontender to palpation. CT scan was negative. Chest x-ray negative for any pneumonia. Labs without any sniffing abnormalities no leukocytosis no liver function test abnormalities lipase was also normal not consistent with pancreatitis. Urinalysis was negative and pregnancy test was negative. Patient would be treated as if this could be gastritis or peptic ulcer disease. Patient will be given Pepcid Phenergan and hydrocodone. Patient with improvement here in the emergency department with hydromorphone and Protonix.    Vanetta Mulders, MD 11/16/13 [phone]

## 2015-05-27 IMAGING — CR DG CHEST 2V
2 series · 2 of 2 positions shown · non-contrast
Comparison: 05/21/2009

CLINICAL DATA: Abdominal pain

EXAM:
CHEST  2 VIEW

[w chest pa]
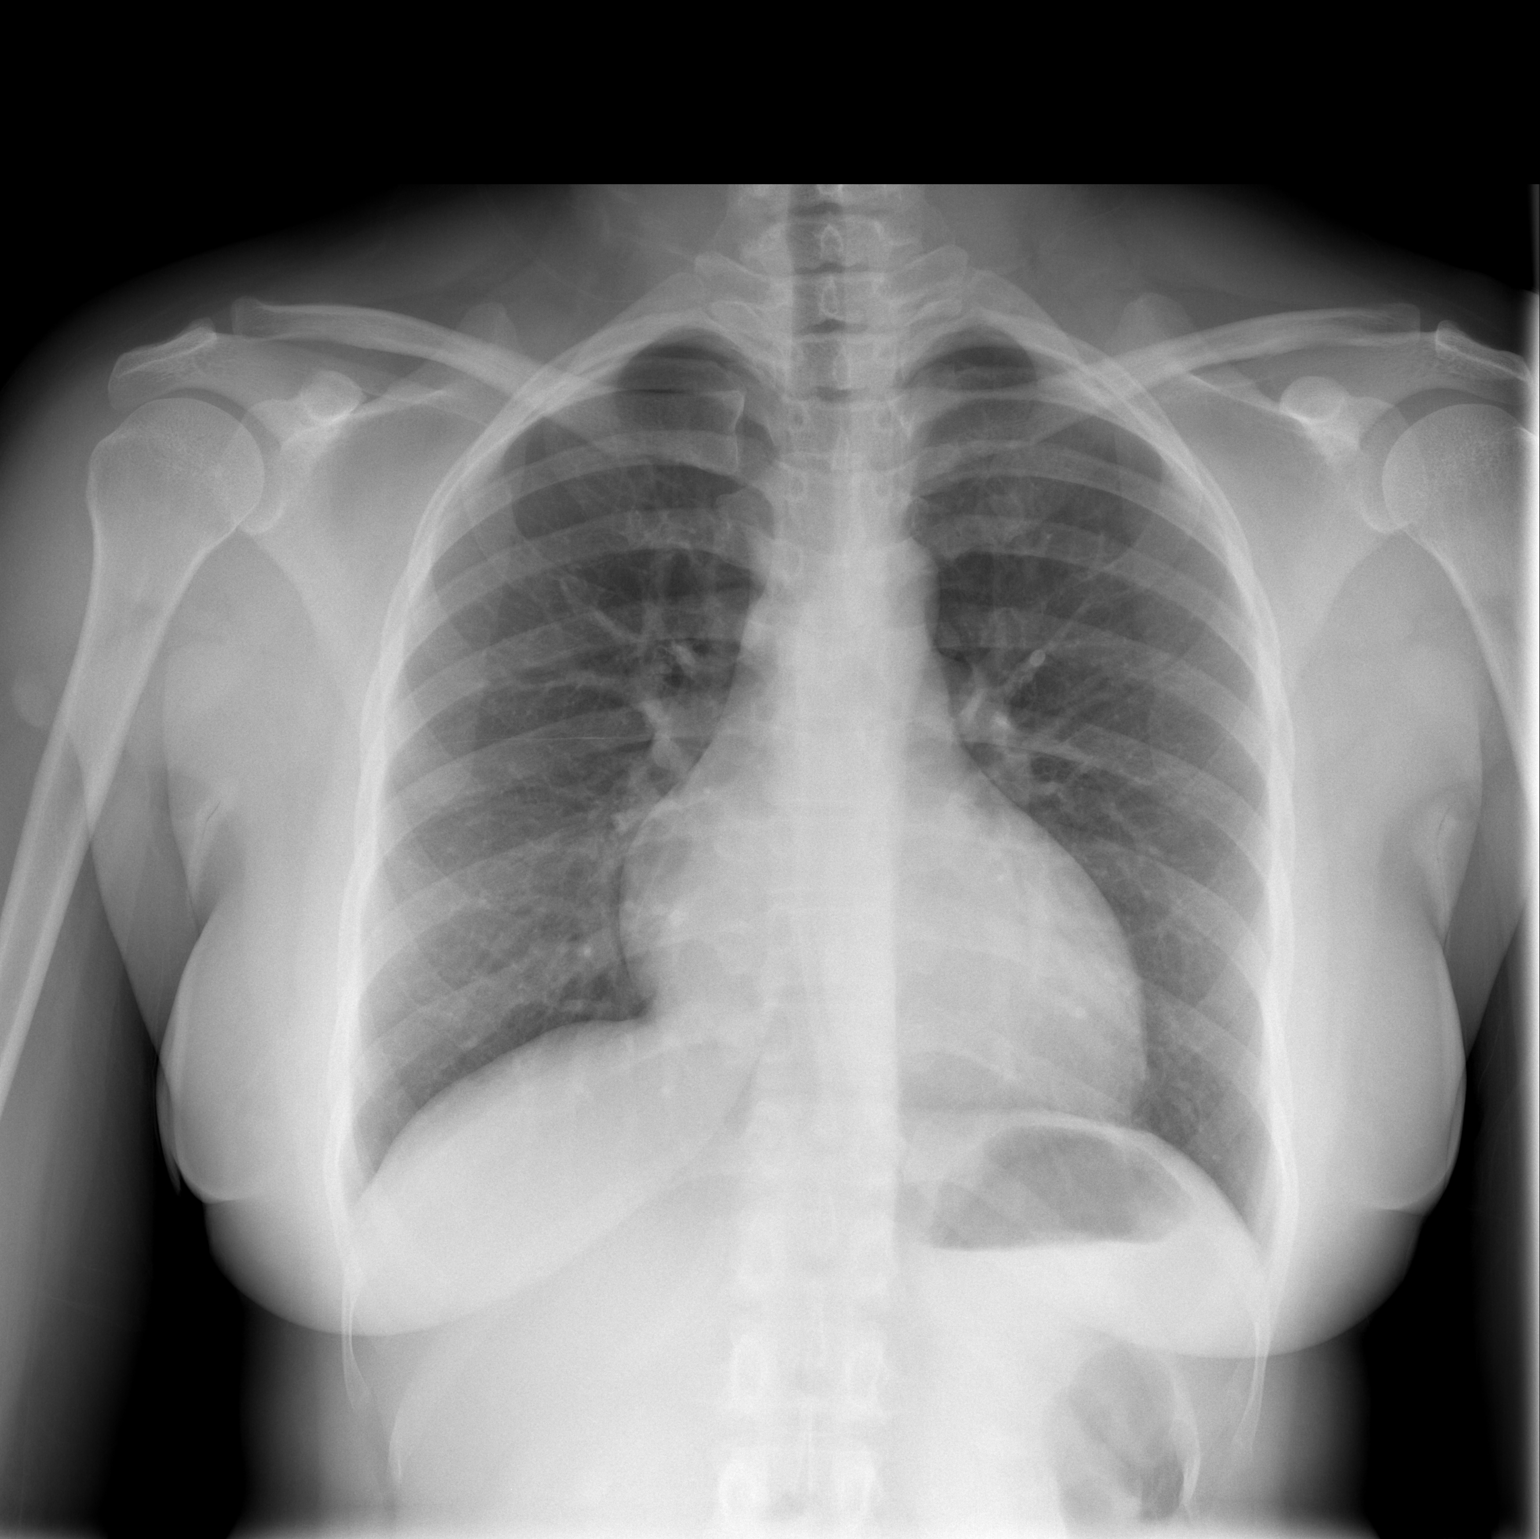

[w chest lat]
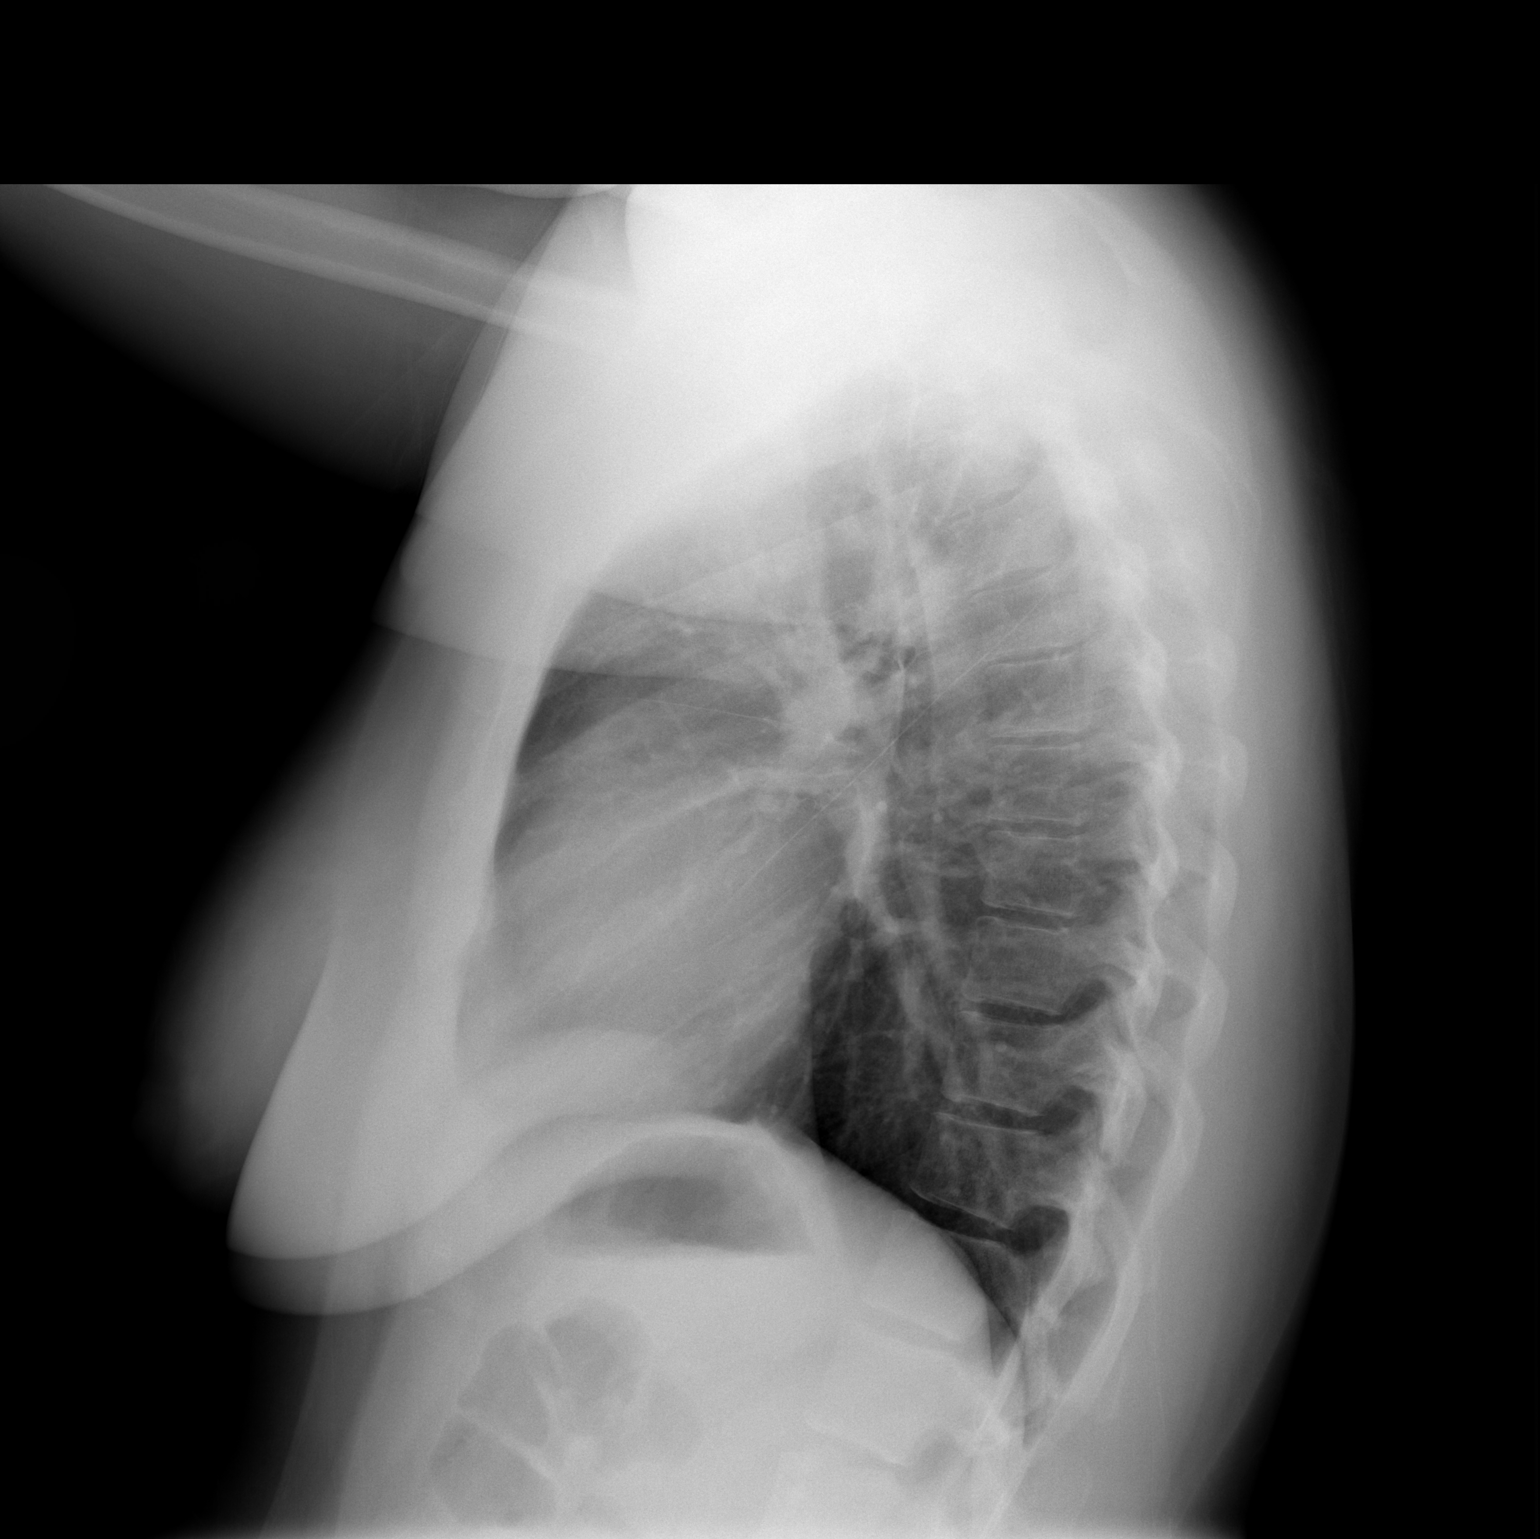

[2 of 2 positions shown; findings below may reference images not displayed]

FINDINGS: Prominent heart size which is increased from the prior study.
Vascularity normal. Lungs are clear without infiltrate edema or
effusion.
IMPRESSION: No active cardiopulmonary disease.  Heart size upper normal.

## 2015-05-27 IMAGING — CT CT ABD-PELV W/ CM
2 of 4 series · 17 of 46 positions shown, 19 images · IV contrast (APPLIED)
Comparison: None.

CLINICAL DATA: ABDOMINAL PAIN

EXAM:
CT ABDOMEN AND PELVIS WITH CONTRAST
TECHNIQUE: Multidetector CT imaging of the abdomen and pelvis was performed
using the standard protocol following bolus administration of
intravenous contrast.
CONTRAST:  50mL OMNIPAQUE IOHEXOL 300 MG/ML SOLN, 100mL OMNIPAQUE
IOHEXOL 300 MG/ML SOLN

[Series 2: abd/pelvis 5.0 b31f · axial · 0.77mm/px · z∈[-281,+139]mm · 14 of 92 slices shown, 16 images]
[im 4/92  soft-tissue]
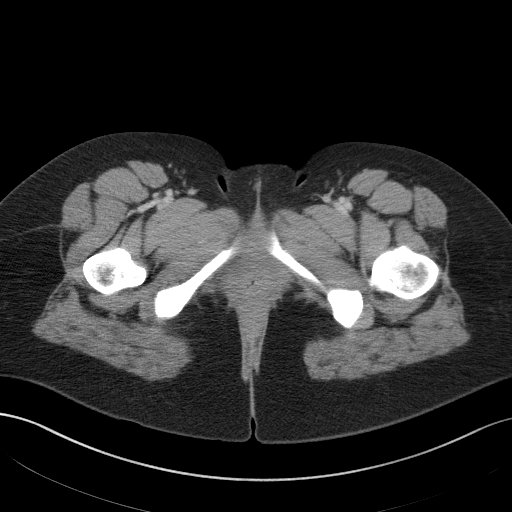
[im 4/92  bone]
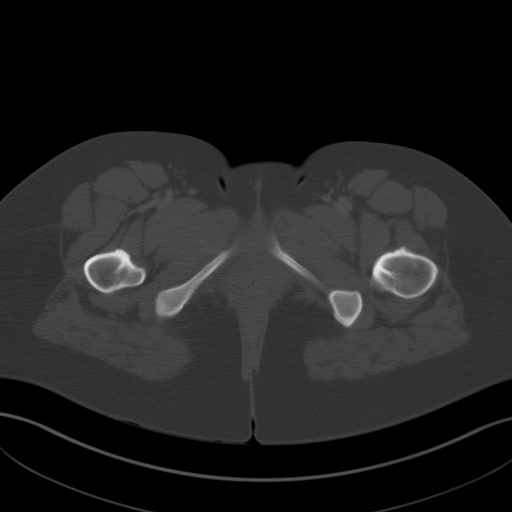
[im 12/92  soft-tissue]
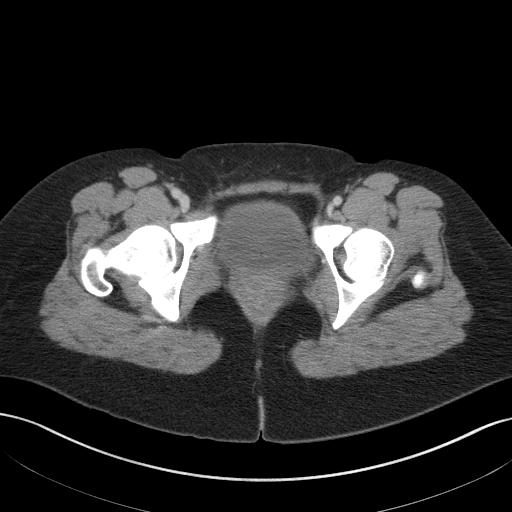
[im 19/92  soft-tissue]
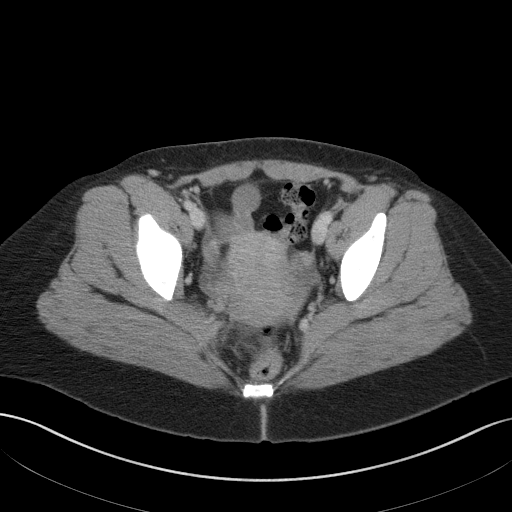
[im 23/92  soft-tissue]
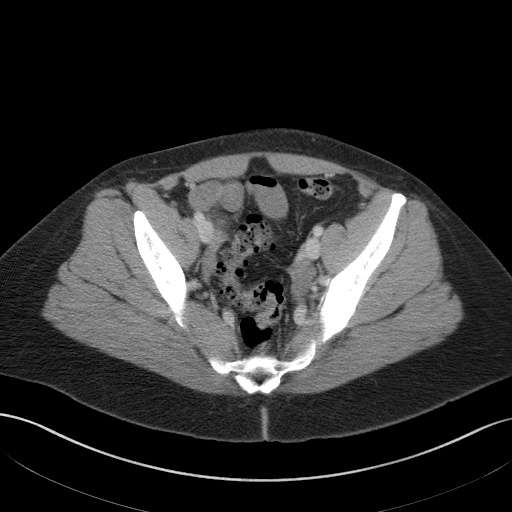
[im 31/92  soft-tissue]
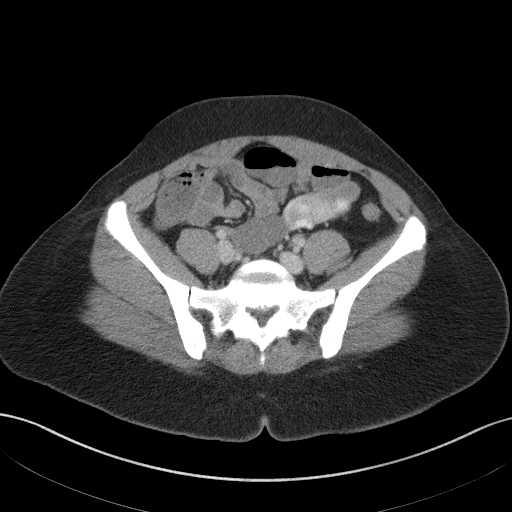
[im 38/92  soft-tissue]
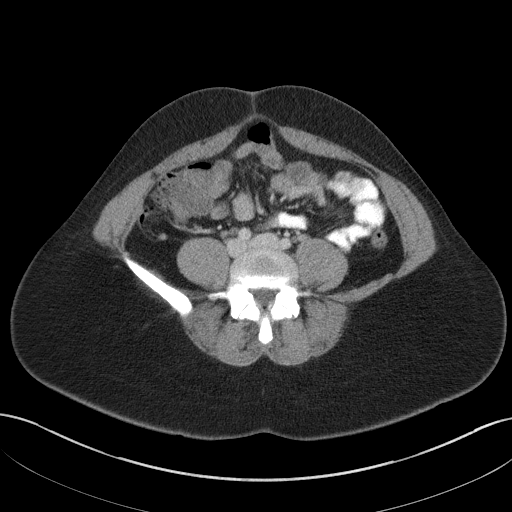
[im 42/92  soft-tissue]
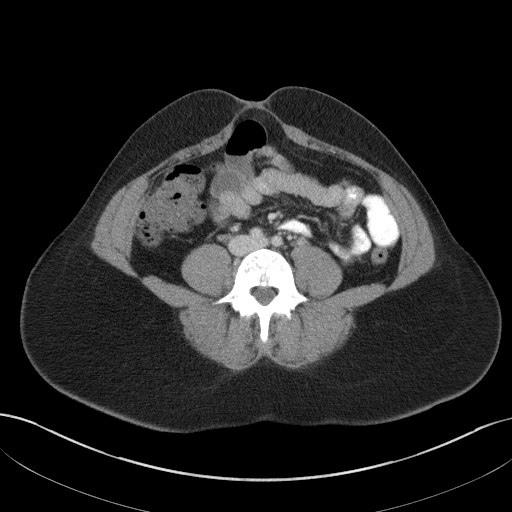
[im 50/92  soft-tissue]
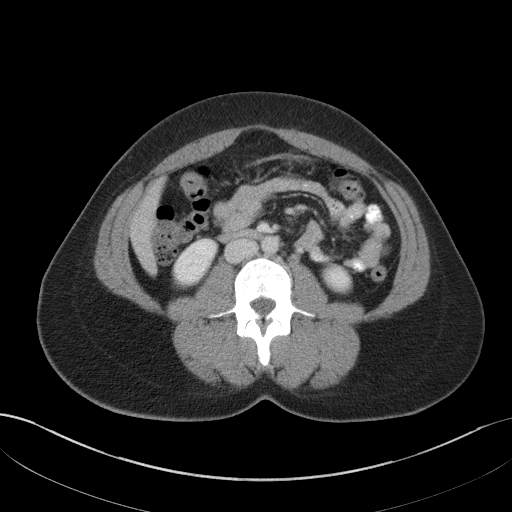
[im 54/92  soft-tissue]
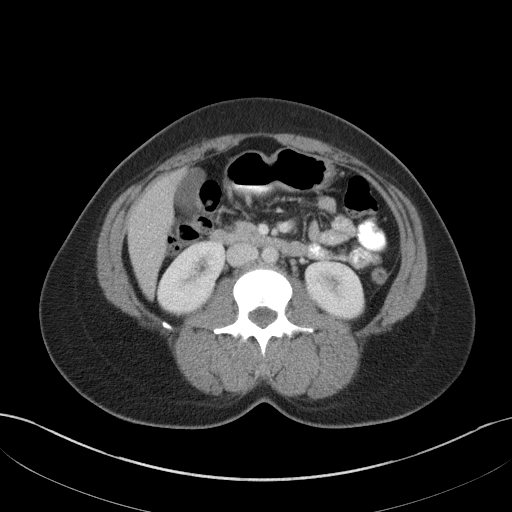
[im 54/92  bone]
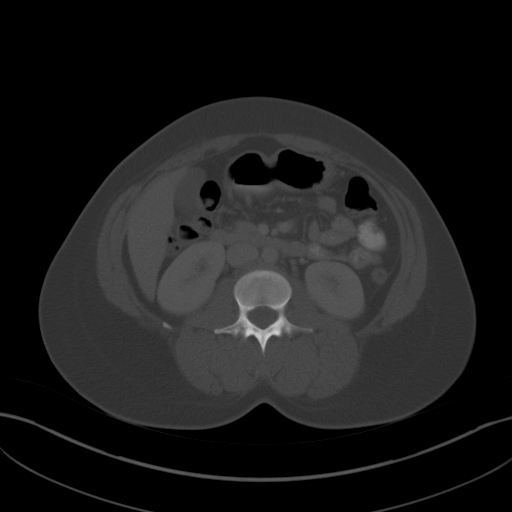
[im 61/92  soft-tissue]
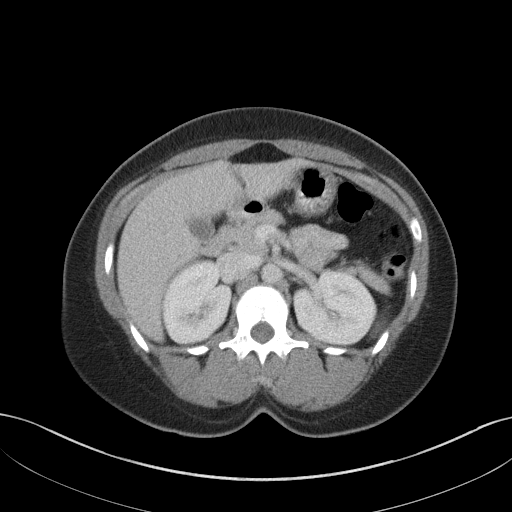
[im 69/92  soft-tissue]
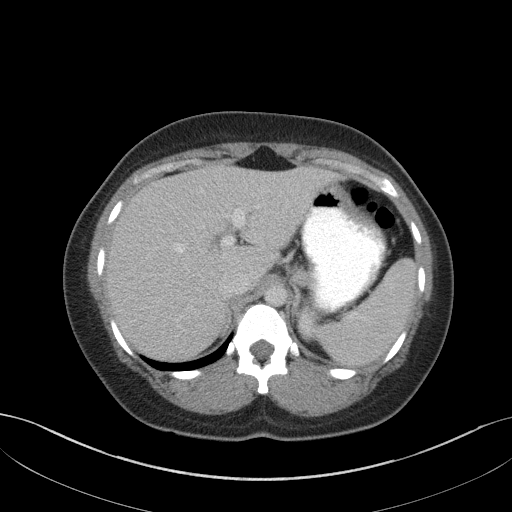
[im 73/92  soft-tissue]
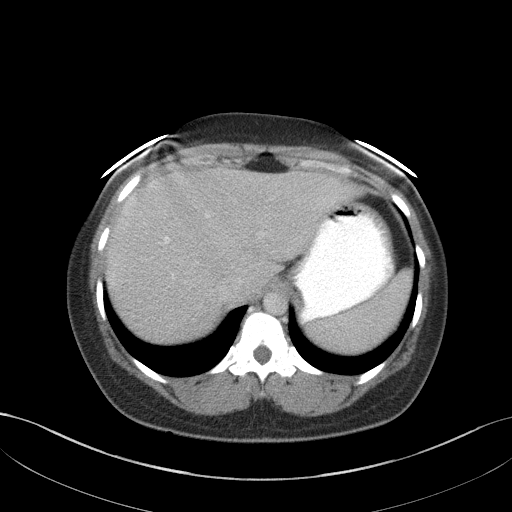
[im 80/92  soft-tissue]
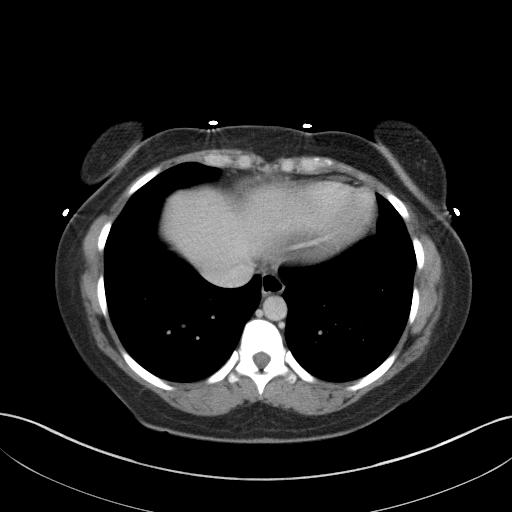
[im 88/92  soft-tissue]
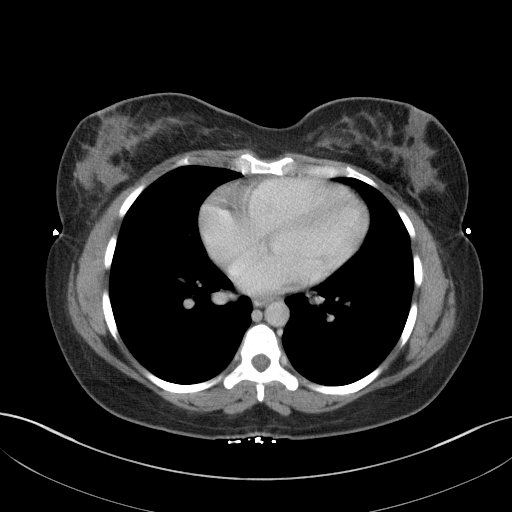

[Series 5: abd/pelvis 3.0 coronal · coronal · 0.75mm/px · 3 of 93 slices shown]
[im 31/93  soft-tissue]
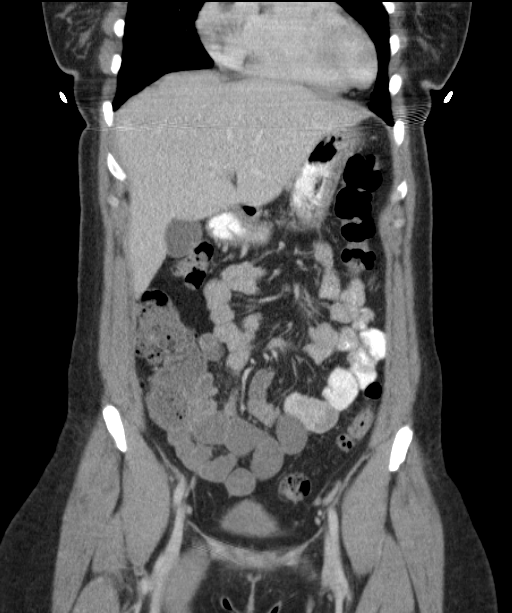
[im 41/93  soft-tissue]
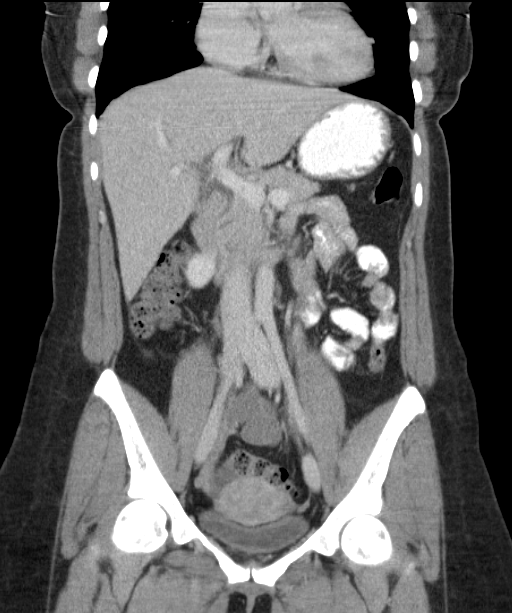
[im 52/93  soft-tissue]
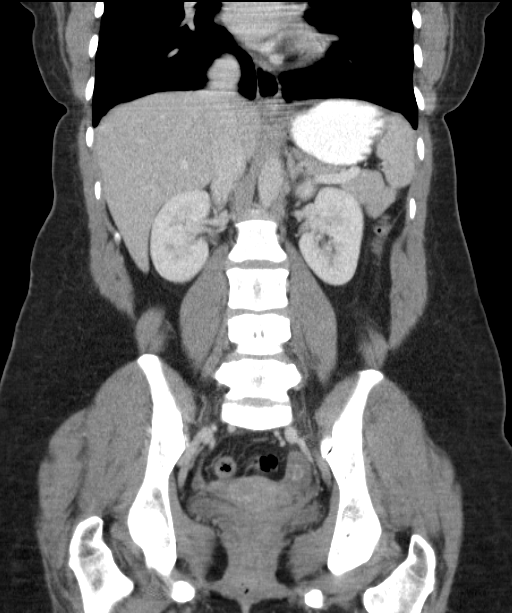

[17 of 46 positions shown; findings below may reference images not displayed]

FINDINGS: Lung bases negative.

The liver, spleen, adrenals, pancreas, kidneys are unremarkable.

The celiac, SMA, IMA, portal vein, SMV are opacified. No abdominal
aortic aneurysm.

No abdominal or pelvic masses, free fluid, loculated fluid
collections, nor adenopathy.

There is mild diverticulosis within the sigmoid colon without
diverticulitis. Otherwise the bowel, appendix, and gallbladder fossa
are negative. Moderate amount of stool within the colon.

A small umbilical hernia containing fat. A nonobstructed, non
inflamed loop of bowel is adjacent to the hernia. No inguinal
hernia.

No aggressive appearing osseous lesions.
IMPRESSION: Small umbilical hernia. Otherwise no evidence of abdominal or pelvic
pathology. No CT evidence accounting for the patient's clinical
presentation.

## 2016-01-01 ENCOUNTER — Emergency Department (HOSPITAL_BASED_OUTPATIENT_CLINIC_OR_DEPARTMENT_OTHER)
Admission: EM | Admit: 2016-01-01 | Discharge: 2016-01-01 | Disposition: A | Discharge status: Home / Self Care | Payer: BLUE CROSS/BLUE SHIELD | Attending: Emergency Medicine | Admitting: Emergency Medicine

## 2016-01-01 ENCOUNTER — Encounter (HOSPITAL_BASED_OUTPATIENT_CLINIC_OR_DEPARTMENT_OTHER): Payer: Self-pay | Admitting: *Deleted

## 2016-01-01 DIAGNOSIS — R1030 Lower abdominal pain, unspecified: Secondary | ICD-10-CM

## 2016-01-01 DIAGNOSIS — N760 Acute vaginitis: Secondary | ICD-10-CM | POA: Insufficient documentation

## 2016-01-01 DIAGNOSIS — B9689 Other specified bacterial agents as the cause of diseases classified elsewhere: Secondary | ICD-10-CM

## 2016-01-01 DIAGNOSIS — R300 Dysuria: Secondary | ICD-10-CM | POA: Diagnosis present

## 2016-01-01 LAB — WET PREP, GENITAL
SPERM: NONE SEEN
TRICH WET PREP: NONE SEEN
Yeast Wet Prep HPF POC: NONE SEEN

## 2016-01-01 LAB — URINALYSIS, ROUTINE W REFLEX MICROSCOPIC
BILIRUBIN URINE: NEGATIVE
GLUCOSE, UA: NEGATIVE mg/dL
Hgb urine dipstick: NEGATIVE
Ketones, ur: NEGATIVE mg/dL
NITRITE: NEGATIVE
PH: 7 (ref 5.0–8.0)
Protein, ur: NEGATIVE mg/dL
SPECIFIC GRAVITY, URINE: 1.019 (ref 1.005–1.030)

## 2016-01-01 LAB — URINE MICROSCOPIC-ADD ON

## 2016-01-01 LAB — PREGNANCY, URINE: PREG TEST UR: NEGATIVE

## 2016-01-01 MED ORDER — CEFTRIAXONE SODIUM 250 MG IJ SOLR
250.0000 mg | Freq: Once | INTRAMUSCULAR | Status: AC
  Administered 2016-01-01: 250 mg via INTRAMUSCULAR
  Filled 2016-01-01: qty 250

## 2016-01-01 MED ORDER — IBUPROFEN 800 MG PO TABS
800.0000 mg | ORAL_TABLET | Freq: Once | ORAL | Status: AC
  Administered 2016-01-01: 800 mg via ORAL
  Filled 2016-01-01: qty 1

## 2016-01-01 MED ORDER — AZITHROMYCIN 250 MG PO TABS
1000.0000 mg | ORAL_TABLET | Freq: Once | ORAL | Status: AC
  Administered 2016-01-01: 1000 mg via ORAL
  Filled 2016-01-01: qty 4

## 2016-01-01 MED ORDER — METRONIDAZOLE 500 MG PO TABS: 500.0000 mg | ORAL_TABLET | Freq: Two times a day (BID) | ORAL | 0 refills | Status: DC

## 2016-01-01 NOTE — ED Notes (Signed)
Lower middle abd pain x 3 days, denies N/V, denies vaginal discharge. Also reports burning with urination.

## 2016-01-01 NOTE — ED Provider Notes (Signed)
[facility]   CSN: [id number]   By signing my name below, I, Phillis HaggisGabriella Gaje, attest that this documentation has been prepared under the direction and in the presence of Fayrene HelperBowie Antaeus Karel, PA-C. Electronically Signed: Phillis HaggisGabriella Gaje, ED Scribe. 01/01/16. 7:31 PM.  Arrival date & time: 01/01/16  1804  History   Chief Complaint Chief Complaint  Patient presents with  . Dysuria    The history is provided by the patient. No language interpreter was used.    HPI Comments: Kristi Bailey is a 35 y.o. female who presents to the Emergency Department complaining of gradually worsening, sore, lower abdominal pain onset two days ago. Pt reports worsening pain with walking. Pt reports associated dysuria and frequency. She has not tried anything for her symptoms. Pt recently finished her menstrual period. She denies hx of similar symptoms. She denies hx of STDs, new sexual partners, new strenuous activities, fever, chills, cough, chest pain, SOB, diarrhea, appetite change, dyspareunia, vaginal bleeding, vaginal discharge, or leg pain.   History reviewed. No pertinent past medical history.  There are no active problems to display for this patient.   History reviewed. No pertinent surgical history.  OB History    No data available       Home Medications    Prior to Admission medications   Medication Sig Start Date End Date Taking? Authorizing Provider  famotidine (PEPCID) 20 MG tablet Take 1 tablet (20 mg total) by mouth 2 (two) times daily. 11/16/13   Vanetta MuldersScott Zackowski, MD  folic acid (FOLVITE) 800 MCG tablet Take 1 tablet (800 mcg total) by mouth daily. 02/19/12   Tia L Oliveri, PA-C  HYDROcodone-acetaminophen (NORCO/VICODIN) 5-325 MG per tablet Take 1-2 tablets by mouth every 6 (six) hours as needed for moderate pain. 11/16/13   Vanetta MuldersScott Zackowski, MD  Prenatal Vit-Fe Fumarate-FA (PRENATAL MULTIVITAMIN) TABS Take 1 tablet by mouth daily.    Historical Provider, MD  promethazine  (PHENERGAN) 25 MG tablet Take 1 tablet (25 mg total) by mouth every 6 (six) hours as needed for nausea or vomiting. 11/16/13   Vanetta MuldersScott Zackowski, MD    Family History No family history on file.  Social History Social History  Substance Use Topics  . Smoking status: Never Smoker  . Smokeless tobacco: Never Used  . Alcohol use No     Allergies   Review of patient's allergies indicates no known allergies.   Review of Systems Review of Systems  Constitutional: Negative for chills and fever.  Respiratory: Negative for cough and shortness of breath.   Gastrointestinal: Positive for abdominal pain. Negative for diarrhea.  Genitourinary: Positive for dysuria and frequency. Negative for dyspareunia, vaginal bleeding and vaginal discharge.  Musculoskeletal: Negative for arthralgias.  All other systems reviewed and are negative.    Physical Exam Updated Vital Signs BP 125/63 (BP Location: Right Arm)   Pulse 70   Temp 99 F (37.2 C) (Oral)   Resp 18   Ht 5\' 5"  (1.651 m)   Wt 170 lb (77.1 kg)   LMP 12/25/2015   SpO2 100%   BMI 28.29 kg/m   Physical Exam  Constitutional: She is oriented to person, place, and time. She appears well-developed and well-nourished.  HENT:  Head: Normocephalic and atraumatic.  Eyes: Conjunctivae are normal. Pupils are equal, round, and reactive to light.  Neck: Normal range of motion.  Cardiovascular: Normal rate, regular rhythm and normal heart sounds.   Pulmonary/Chest: Effort normal and breath sounds normal. No respiratory distress.  Abdominal:  Soft. She exhibits no distension. There is tenderness in the suprapubic area. There is no rebound, no guarding and no CVA tenderness.  Genitourinary:  Genitourinary Comments: Pelvic exam: RN in room as chaperone, external female genitalia normal with no signs of lesions or injuries. Speculum exam shows normal cervix with no obvious discharge. Bimanual exam with no adnexal tenderness, no cervical motion  tenderness, uterus normal size and nontender, no masses appreciated. The external cervical os is closed.   Neurological: She is alert and oriented to person, place, and time.  Skin: Skin is warm and dry.  Psychiatric: She has a normal mood and affect. Her behavior is normal.  Nursing note and vitals reviewed.    ED Treatments / Results  DIAGNOSTIC STUDIES: Oxygen Saturation is 100% on RA, normal by my interpretation.    COORDINATION OF CARE: 7:30 PM-Discussed treatment plan which includes labs and pelvic exam with pt at bedside and pt agreed to plan.    Labs (all labs ordered are listed, but only abnormal results are displayed) Labs Reviewed  WET PREP, GENITAL - Abnormal; Notable for the following:       Result Value   Clue Cells Wet Prep HPF POC PRESENT (*)    WBC, Wet Prep HPF POC MANY (*)    All other components within normal limits  URINALYSIS, ROUTINE W REFLEX MICROSCOPIC (NOT AT [facility]) - Abnormal; Notable for the following:    Leukocytes, UA SMALL (*)    All other components within normal limits  URINE MICROSCOPIC-ADD ON - Abnormal; Notable for the following:    Squamous Epithelial / LPF 0-5 (*)    Bacteria, UA RARE (*)    All other components within normal limits  PREGNANCY, URINE  HIV ANTIBODY (ROUTINE TESTING)  RPR  GC/CHLAMYDIA PROBE AMP ([place]) NOT AT [facility]    EKG  EKG Interpretation None       Radiology No results found.  Procedures Pelvic exam Date/Time: 01/01/2016 7:41 PM Performed by: Fayrene Helper Authorized by: Loren Racer  Consent: Verbal consent obtained. Risks and benefits: risks, benefits and alternatives were discussed Consent given by: patient Patient understanding: patient states understanding of the procedure being performed Patient consent: the patient's understanding of the procedure matches consent given Patient identity confirmed: verbally with patient Preparation: Patient was prepped and draped in the usual sterile  fashion.    (including critical care time)  Medications Ordered in ED Medications  ibuprofen (ADVIL,MOTRIN) tablet 800 mg (not administered)  cefTRIAXone (ROCEPHIN) injection 250 mg (not administered)  azithromycin (ZITHROMAX) tablet 1,000 mg (not administered)     Initial Impression / Assessment and Plan / ED Course  I have reviewed the triage vital signs and the nursing notes.  Pertinent labs & imaging results that were available during my care of the patient were reviewed by me and considered in my medical decision making (see chart for details).  Clinical Course    BP 120/84 (BP Location: Right Arm)   Pulse 66   Temp 98.5 F (36.9 C) (Oral)   Resp 18   Ht 5\' 5"  (1.651 m)   Wt 77.1 kg   LMP 12/25/2015   SpO2 100%   BMI 28.29 kg/m    Final Clinical Impressions(s) / ED Diagnoses   MDM: Patient is nontoxic, nonseptic appearing, in no apparent distress.  Patient's pain and other symptoms adequately managed in emergency department.  Ibuprofen, Rocephin, and Zithromax given in the ED.  Labs, imaging and vitals reviewed.  Patient does not meet  the SIRS or Sepsis criteria.  There are no peritoneal signs.  No indication of appendicitis, bowel obstruction, bowel perforation, cholecystitis, diverticulitis, PID or ectopic pregnancy.  Patient discharged home with symptomatic treatment and given strict instructions for follow-up with their primary care physician.  I have also discussed reasons to return immediately to the ER.  Patient expresses understanding and agrees with plan.  Final diagnoses:  Lower abdominal pain  BV (bacterial vaginosis)   I personally performed the services described in this documentation, which was scribed in my presence. The recorded information has been reviewed and is accurate.     New Prescriptions New Prescriptions   METRONIDAZOLE (FLAGYL) 500 MG TABLET    Take 1 tablet (500 mg total) by mouth 2 (two) times daily. One po bid x 7 days     Fayrene HelperBowie  Atiyana Welte, PA-C 01/01/16 2120    Loren Raceravid Yelverton, MD 01/06/16 [phone]

## 2016-01-01 NOTE — ED Triage Notes (Signed)
Dysuria x 2 days. Urinary frequency.  

## 2016-01-01 NOTE — Discharge Instructions (Signed)
You will be contacted in the next 2-3 days if you tested positive for STD.  Take antibiotic as prescribed for the full duration.  Avoid drinking alcohol while taking the medication.

## 2016-01-01 NOTE — ED Notes (Signed)
PA at bedside at this time.  

## 2016-01-02 LAB — GC/CHLAMYDIA PROBE AMP (~~LOC~~) NOT AT ARMC
CHLAMYDIA, DNA PROBE: NEGATIVE
NEISSERIA GONORRHEA: NEGATIVE

## 2016-01-03 LAB — RPR: RPR Ser Ql: NONREACTIVE

## 2016-01-03 LAB — HIV ANTIBODY (ROUTINE TESTING W REFLEX): HIV SCREEN 4TH GENERATION: NONREACTIVE

## 2017-08-25 ENCOUNTER — Encounter (HOSPITAL_BASED_OUTPATIENT_CLINIC_OR_DEPARTMENT_OTHER): Payer: Self-pay | Admitting: *Deleted

## 2017-08-25 ENCOUNTER — Emergency Department (HOSPITAL_BASED_OUTPATIENT_CLINIC_OR_DEPARTMENT_OTHER)
Admission: EM | Admit: 2017-08-25 | Discharge: 2017-08-26 | Disposition: A | Discharge status: Home / Self Care | Payer: BLUE CROSS/BLUE SHIELD | Attending: Emergency Medicine | Admitting: Emergency Medicine

## 2017-08-25 ENCOUNTER — Other Ambulatory Visit: Payer: Self-pay

## 2017-08-25 DIAGNOSIS — R109 Unspecified abdominal pain: Secondary | ICD-10-CM | POA: Diagnosis present

## 2017-08-25 DIAGNOSIS — N3001 Acute cystitis with hematuria: Secondary | ICD-10-CM | POA: Insufficient documentation

## 2017-08-25 LAB — URINALYSIS, MICROSCOPIC (REFLEX)

## 2017-08-25 LAB — URINALYSIS, ROUTINE W REFLEX MICROSCOPIC
Bilirubin Urine: NEGATIVE
GLUCOSE, UA: NEGATIVE mg/dL
Ketones, ur: NEGATIVE mg/dL
Nitrite: NEGATIVE
PH: 8 (ref 5.0–8.0)
Protein, ur: 100 mg/dL — AB

## 2017-08-25 LAB — PREGNANCY, URINE: Preg Test, Ur: NEGATIVE

## 2017-08-25 NOTE — ED Triage Notes (Signed)
Lower abdominal pain, urinary frequency and scanty urine. States she is having vaginal bleeding every time she urinates.

## 2017-08-26 MED ORDER — PHENAZOPYRIDINE HCL 100 MG PO TABS
200.0000 mg | ORAL_TABLET | Freq: Once | ORAL | Status: AC
  Administered 2017-08-26: 200 mg via ORAL
  Filled 2017-08-26: qty 2

## 2017-08-26 MED ORDER — CEPHALEXIN 500 MG PO CAPS: 500.0000 mg | ORAL_CAPSULE | Freq: Two times a day (BID) | ORAL | 0 refills | Status: DC

## 2017-08-26 MED ORDER — CEPHALEXIN 250 MG PO CAPS
1000.0000 mg | ORAL_CAPSULE | Freq: Once | ORAL | Status: AC
  Administered 2017-08-26: 1000 mg via ORAL
  Filled 2017-08-26: qty 4

## 2017-08-26 MED ORDER — PHENAZOPYRIDINE HCL 200 MG PO TABS: 200.0000 mg | ORAL_TABLET | Freq: Three times a day (TID) | ORAL | 0 refills | Status: DC | PRN

## 2017-08-26 NOTE — ED Provider Notes (Signed)
[facility] EMERGENCY DEPARTMENT Provider Note   CSN: [id number] Arrival date & time: 08/25/17  2155     History   Chief Complaint Chief Complaint  Patient presents with  . Abdominal Pain  . Vaginal Bleeding    HPI Kristi Bailey is a 37 y.o. female.  Presents to the emergency department for evaluation of lower abdominal and pelvic pain, cramping, urinary frequency, low urine volume, pain with urination and blood in her urine.  Symptoms began this evening.  She has not had any fever, nausea or vomiting.     History reviewed. No pertinent past medical history.  There are no active problems to display for this patient.   History reviewed. No pertinent surgical history.   OB History   None      Home Medications    Prior to Admission medications   Medication Sig Start Date End Date Taking? Authorizing Provider  cephALEXin (KEFLEX) 500 MG capsule Take 1 capsule (500 mg total) by mouth 2 (two) times daily. 08/26/17   Gilda CreasePollina, Wylma Tatem J, MD  famotidine (PEPCID) 20 MG tablet Take 1 tablet (20 mg total) by mouth 2 (two) times daily. 11/16/13   Vanetta MuldersZackowski, Scott, MD  folic acid (FOLVITE) 800 MCG tablet Take 1 tablet (800 mcg total) by mouth daily. 02/19/12   Oliveri, Tia L, PA-C  HYDROcodone-acetaminophen (NORCO/VICODIN) 5-325 MG per tablet Take 1-2 tablets by mouth every 6 (six) hours as needed for moderate pain. 11/16/13   Vanetta MuldersZackowski, Scott, MD  metroNIDAZOLE (FLAGYL) 500 MG tablet Take 1 tablet (500 mg total) by mouth 2 (two) times daily. One po bid x 7 days 01/01/16   Fayrene Helperran, Bowie, PA-C  phenazopyridine (PYRIDIUM) 200 MG tablet Take 1 tablet (200 mg total) by mouth 3 (three) times daily as needed for pain. 08/26/17   Gilda CreasePollina, Eloyse Causey J, MD  Prenatal Vit-Fe Fumarate-FA (PRENATAL MULTIVITAMIN) TABS Take 1 tablet by mouth daily.    [provider]  promethazine (PHENERGAN) 25 MG tablet Take 1 tablet (25 mg total) by mouth every 6 (six) hours as needed for nausea or  vomiting. 11/16/13   Vanetta MuldersZackowski, Scott, MD    Family History No family history on file.  Social History Social History   Tobacco Use  . Smoking status: Never Smoker  . Smokeless tobacco: Never Used  Substance Use Topics  . Alcohol use: No  . Drug use: Not on file     Allergies   Patient has no known allergies.   Review of Systems Review of Systems  Genitourinary: Positive for decreased urine volume, dysuria, frequency and urgency.  All other systems reviewed and are negative.    Physical Exam Updated Vital Signs BP 118/76 (BP Location: Right Arm)   Pulse 85   Temp 98.9 F (37.2 C) (Oral)   Resp 18   Ht 5\' 5"  (1.651 m)   Wt 85.3 kg (188 lb)   LMP 08/18/2017   SpO2 100%   BMI 31.28 kg/m   Physical Exam  Constitutional: She is oriented to person, place, and time. She appears well-developed and well-nourished. No distress.  HENT:  Head: Normocephalic and atraumatic.  Right Ear: Hearing normal.  Left Ear: Hearing normal.  Nose: Nose normal.  Mouth/Throat: Oropharynx is clear and moist and mucous membranes are normal.  Eyes: Pupils are equal, round, and reactive to light. Conjunctivae and EOM are normal.  Neck: Normal range of motion. Neck supple.  Cardiovascular: Regular rhythm, S1 normal and S2 normal. Exam reveals no gallop and no  friction rub.  No murmur heard. Pulmonary/Chest: Effort normal and breath sounds normal. No respiratory distress. She exhibits no tenderness.  Abdominal: Soft. Normal appearance and bowel sounds are normal. There is no hepatosplenomegaly. There is no tenderness. There is no rebound, no guarding, no tenderness at McBurney's point and negative Murphy's sign. No hernia.  Musculoskeletal: Normal range of motion.  Neurological: She is alert and oriented to person, place, and time. She has normal strength. No cranial nerve deficit or sensory deficit. Coordination normal. GCS eye subscore is 4. GCS verbal subscore is 5. GCS motor subscore is 6.   Skin: Skin is warm, dry and intact. No rash noted. No cyanosis.  Psychiatric: She has a normal mood and affect. Her speech is normal and behavior is normal. Thought content normal.  Nursing note and vitals reviewed.    ED Treatments / Results  Labs (all labs ordered are listed, but only abnormal results are displayed) Labs Reviewed  URINALYSIS, ROUTINE W REFLEX MICROSCOPIC - Abnormal; Notable for the following components:      Result Value   Color, Urine RED (*)    APPearance HAZY (*)    Specific Gravity, Urine <1.005 (*)    Hgb urine dipstick LARGE (*)    Protein, ur 100 (*)    Leukocytes, UA LARGE (*)    All other components within normal limits  URINALYSIS, MICROSCOPIC (REFLEX) - Abnormal; Notable for the following components:   Bacteria, UA FEW (*)    Squamous Epithelial / LPF 0-5 (*)    All other components within normal limits  PREGNANCY, URINE    EKG None  Radiology No results found.  Procedures Procedures (including critical care time)  Medications Ordered in ED Medications  phenazopyridine (PYRIDIUM) tablet 200 mg (has no administration in time range)  cephALEXin (KEFLEX) capsule 1,000 mg (has no administration in time range)     Initial Impression / Assessment and Plan / ED Course  I have reviewed the triage vital signs and the nursing notes.  Pertinent labs & imaging results that were available during my care of the patient were reviewed by me and considered in my medical decision making (see chart for details).    Patient presents with urinary frequency, dysuria, hematuria, low urine volume and bladder area discomfort that began earlier this evening.  Symptoms are very consistent with urinary tract infection with bladder spasm.  Urinalysis confirms infection.  She appears well, no signs of systemic illness, no fever, no concern for pyelonephritis.  She does not have any symptoms that would be concerning for kidney stone.  Will treat with Pyridium and  Keflex.  Final Clinical Impressions(s) / ED Diagnoses   Final diagnoses:  Acute cystitis with hematuria    ED Discharge Orders        Ordered    cephALEXin (KEFLEX) 500 MG capsule  2 times daily     08/26/17 0031    phenazopyridine (PYRIDIUM) 200 MG tablet  3 times daily PRN     08/26/17 0031       Gilda Crease, MD 08/26/17 0031

## 2018-07-06 ENCOUNTER — Emergency Department (HOSPITAL_BASED_OUTPATIENT_CLINIC_OR_DEPARTMENT_OTHER)
Admission: EM | Admit: 2018-07-06 | Discharge: 2018-07-06 | Disposition: A | Discharge status: Home / Self Care | Payer: BLUE CROSS/BLUE SHIELD | Attending: Emergency Medicine | Admitting: Emergency Medicine

## 2018-07-06 ENCOUNTER — Other Ambulatory Visit: Payer: Self-pay

## 2018-07-06 ENCOUNTER — Encounter (HOSPITAL_BASED_OUTPATIENT_CLINIC_OR_DEPARTMENT_OTHER): Payer: Self-pay | Admitting: *Deleted

## 2018-07-06 DIAGNOSIS — R5383 Other fatigue: Secondary | ICD-10-CM | POA: Insufficient documentation

## 2018-07-06 DIAGNOSIS — R55 Syncope and collapse: Secondary | ICD-10-CM | POA: Insufficient documentation

## 2018-07-06 DIAGNOSIS — Z79899 Other long term (current) drug therapy: Secondary | ICD-10-CM | POA: Insufficient documentation

## 2018-07-06 HISTORY — DX: Anemia, unspecified: D64.9

## 2018-07-06 HISTORY — DX: Sickle-cell trait: D57.3

## 2018-07-06 LAB — BASIC METABOLIC PANEL
ANION GAP: 7 (ref 5–15)
BUN: 12 mg/dL (ref 6–20)
CALCIUM: 9.4 mg/dL (ref 8.9–10.3)
CO2: 28 mmol/L (ref 22–32)
Chloride: 102 mmol/L (ref 98–111)
Creatinine, Ser: 0.65 mg/dL (ref 0.44–1.00)
GLUCOSE: 93 mg/dL (ref 70–99)
Potassium: 4 mmol/L (ref 3.5–5.1)
Sodium: 137 mmol/L (ref 135–145)

## 2018-07-06 LAB — PHOSPHORUS: Phosphorus: 3.9 mg/dL (ref 2.5–4.6)

## 2018-07-06 LAB — CBC WITH DIFFERENTIAL/PLATELET
Abs Immature Granulocytes: 0 10*3/uL (ref 0.00–0.07)
BASOS PCT: 1 %
Basophils Absolute: 0 10*3/uL (ref 0.0–0.1)
EOS ABS: 0.1 10*3/uL (ref 0.0–0.5)
EOS PCT: 2 %
HCT: 37.9 % (ref 36.0–46.0)
HEMOGLOBIN: 12.2 g/dL (ref 12.0–15.0)
Immature Granulocytes: 0 %
Lymphocytes Relative: 43 %
Lymphs Abs: 2.7 10*3/uL (ref 0.7–4.0)
MCH: 27.2 pg (ref 26.0–34.0)
MCHC: 32.2 g/dL (ref 30.0–36.0)
MCV: 84.4 fL (ref 80.0–100.0)
MONO ABS: 0.6 10*3/uL (ref 0.1–1.0)
Monocytes Relative: 10 %
NEUTROS PCT: 44 %
Neutro Abs: 2.9 10*3/uL (ref 1.7–7.7)
Platelets: 200 10*3/uL (ref 150–400)
RBC: 4.49 MIL/uL (ref 3.87–5.11)
RDW: 12.9 % (ref 11.5–15.5)
WBC: 6.4 10*3/uL (ref 4.0–10.5)
nRBC: 0 % (ref 0.0–0.2)

## 2018-07-06 LAB — URINALYSIS, ROUTINE W REFLEX MICROSCOPIC
BILIRUBIN URINE: NEGATIVE
Glucose, UA: NEGATIVE mg/dL
HGB URINE DIPSTICK: NEGATIVE
Ketones, ur: NEGATIVE mg/dL
Leukocytes,Ua: NEGATIVE
Nitrite: NEGATIVE
Protein, ur: NEGATIVE mg/dL
Specific Gravity, Urine: 1.015 (ref 1.005–1.030)
pH: 6.5 (ref 5.0–8.0)

## 2018-07-06 LAB — MAGNESIUM: Magnesium: 2 mg/dL (ref 1.7–2.4)

## 2018-07-06 LAB — PREGNANCY, URINE: PREG TEST UR: NEGATIVE

## 2018-07-06 NOTE — ED Triage Notes (Addendum)
Pt c/o dizziness x 2 months , seen by PMD x 3 this month for same Dx anemia

## 2018-07-06 NOTE — ED Notes (Signed)
Pt ambulated to RR unassisted 

## 2018-07-06 NOTE — Discharge Instructions (Signed)
1.  Continue to stay hydrated, eat healthy diet and take iron supplements as you have been. 2.  Take time when you change from sitting to standing.  Stand for a few seconds in place before you start walking.  If you feel lightheaded or dizzy sit back down.  If you feel you are going to pass out get in a lying down position with your legs elevated. 3.  See your family doctor soon as possible.  Return to the emergency department if you have recurrence of symptoms or other concerning symptoms develop.

## 2018-07-06 NOTE — ED Provider Notes (Signed)
[facility] EMERGENCY DEPARTMENT Provider Note   CSN: [id number] Arrival date & time: 07/06/18  1323    History   Chief Complaint Chief Complaint  Patient presents with  . Dizziness    HPI Kristi Bailey is a 38 y.o. female.     HPI Patient reports for several months she has been getting dizziness particularly with standing.  It is lightheadedness in quality.  She reports she feels like she might pass out when it happens.  She reports she also has had a lot of fatigue for a while now.  Denies spinning quality of dizziness.  No headache no visual changes.  Patient reports a history of anemia.  She reports she is taking over-the-counter iron and vitamin D supplements.  He is also taking extra magnesium.  She reports she is drinking a gallon to a gallon a half of water per day.  She does have heavy menstrual cycles but that is been for all of her life.  No change in length for amount of bleeding.  She denies she has any pain.  She denies any chest pain or abdominal pain.  Reports that she exercises at the gym and does not get problems with excessive shortness of breath or chest pain or syncope.  At work today.  She reports she got up and was walking and got very lightheaded and had to drop down to the ground.  She reports EMS was called and she was told her blood pressure was elevated and her heart rate was up.  They brought her to the emergency department.  Denies she had any chest pain or palpitations preceding this episode.  No history of drug or alcohol use.  She reports she is otherwise healthy with history of sickle cell trait with no complications. Past Medical History:  Diagnosis Date  . Anemia   . Sickle cell trait (HCC)     There are no active problems to display for this patient.   History reviewed. No pertinent surgical history.   OB History   No obstetric history on file.      Home Medications    Prior to Admission medications   Medication Sig Start Date End  Date Taking? Authorizing Provider  famotidine (PEPCID) 20 MG tablet Take 1 tablet (20 mg total) by mouth 2 (two) times daily. 11/16/13   Vanetta Mulders, MD  folic acid (FOLVITE) 800 MCG tablet Take 1 tablet (800 mcg total) by mouth daily. 02/19/12   Oliveri, Tia L, PA-C  metroNIDAZOLE (FLAGYL) 500 MG tablet Take 1 tablet (500 mg total) by mouth 2 (two) times daily. One po bid x 7 days 01/01/16   Fayrene Helper, PA-C    Family History History reviewed. No pertinent family history.  Social History Social History   Tobacco Use  . Smoking status: Never Smoker  . Smokeless tobacco: Never Used  Substance Use Topics  . Alcohol use: No  . Drug use: Not on file     Allergies   Patient has no known allergies.   Review of Systems Review of Systems 10 Systems reviewed and are negative for acute change except as noted in the HPI.   Physical Exam Updated Vital Signs BP 120/77 (BP Location: Right Arm)   Pulse 63   Temp (!) 97.5 F (36.4 C)   Resp 16   Ht 5\' 5"  (1.651 m)   Wt 79.4 kg   LMP 06/27/2018   SpO2 100%   BMI 29.12 kg/m   Physical Exam  Constitutional:      Appearance: Normal appearance. She is well-developed.  HENT:     Head: Normocephalic and atraumatic.     Nose: Nose normal.     Mouth/Throat:     Mouth: Mucous membranes are moist.  Eyes:     Extraocular Movements: Extraocular movements intact.     Conjunctiva/sclera: Conjunctivae normal.     Pupils: Pupils are equal, round, and reactive to light.  Neck:     Musculoskeletal: Neck supple.  Cardiovascular:     Rate and Rhythm: Normal rate and regular rhythm.     Pulses: Normal pulses.     Heart sounds: Normal heart sounds.  Pulmonary:     Effort: Pulmonary effort is normal.     Breath sounds: Normal breath sounds.  Abdominal:     General: Bowel sounds are normal. There is no distension.     Palpations: Abdomen is soft.     Tenderness: There is no abdominal tenderness.  Musculoskeletal: Normal range of motion.         General: No swelling or tenderness.  Skin:    General: Skin is warm and dry.  Neurological:     General: No focal deficit present.     Mental Status: She is alert and oriented to person, place, and time.     GCS: GCS eye subscore is 4. GCS verbal subscore is 5. GCS motor subscore is 6.     Cranial Nerves: No cranial nerve deficit.     Coordination: Coordination normal.  Psychiatric:        Mood and Affect: Mood normal.      ED Treatments / Results  Labs (all labs ordered are listed, but only abnormal results are displayed) Labs Reviewed  BASIC METABOLIC PANEL  CBC WITH DIFFERENTIAL/PLATELET  URINALYSIS, ROUTINE W REFLEX MICROSCOPIC  PREGNANCY, URINE  MAGNESIUM  PHOSPHORUS    EKG EKG Interpretation  Date/Time:  Monday July 06 2018 15:27:50 EST Ventricular Rate:  92 PR Interval:    QRS Duration: 81 QT Interval:  371 QTC Calculation: 459 R Axis:   85 Text Interpretation:  Sinus rhythm Consider left atrial enlargement borderline LVH, No ischemic appearance. no old comparison Confirmed by Arby Barrette [phone]) on 07/06/2018 4:03:13 PM   Radiology No results found.  Procedures Procedures (including critical care time)  Medications Ordered in ED Medications - No data to display   Initial Impression / Assessment and Plan / ED Course  I have reviewed the triage vital signs and the nursing notes.  Pertinent labs & imaging results that were available during my care of the patient were reviewed by me and considered in my medical decision making (see chart for details).       Patient alert and nontoxic.  She has had several months of general fatigue and lightheadedness.  She does not endorse vertiginous symptoms.  No associated ocular symptoms or headache.  Patient does not describe infectious illness.  Does not experience chest pain or shortness of breath.  This time, diagnostic work-up is within normal limits and patient is clinically well in appearance.   Vital signs are stable.  Patient instructed to follow-up with PCP ASAP.  Return precautions reviewed.  Final Clinical Impressions(s) / ED Diagnoses   Final diagnoses:  Near syncope  Fatigue, unspecified type    ED Discharge Orders    None       Arby Barrette, MD 07/06/18 1642

## 2018-07-26 ENCOUNTER — Encounter (HOSPITAL_COMMUNITY): Payer: Self-pay | Admitting: Emergency Medicine

## 2018-07-26 ENCOUNTER — Emergency Department (HOSPITAL_COMMUNITY)
Admission: EM | Admit: 2018-07-26 | Discharge: 2018-07-26 | Disposition: A | Discharge status: Home / Self Care | Payer: BLUE CROSS/BLUE SHIELD | Attending: Emergency Medicine | Admitting: Emergency Medicine

## 2018-07-26 DIAGNOSIS — R002 Palpitations: Secondary | ICD-10-CM | POA: Insufficient documentation

## 2018-07-26 DIAGNOSIS — R42 Dizziness and giddiness: Secondary | ICD-10-CM | POA: Insufficient documentation

## 2018-07-26 DIAGNOSIS — R5381 Other malaise: Secondary | ICD-10-CM | POA: Diagnosis not present

## 2018-07-26 DIAGNOSIS — R000 Tachycardia, unspecified: Secondary | ICD-10-CM | POA: Diagnosis not present

## 2018-07-26 DIAGNOSIS — Z79899 Other long term (current) drug therapy: Secondary | ICD-10-CM | POA: Diagnosis not present

## 2018-07-26 LAB — CBG MONITORING, ED: Glucose-Capillary: 93 mg/dL (ref 70–99)

## 2018-07-26 NOTE — Discharge Instructions (Addendum)
Please see the information and instructions below regarding your visit.  Your diagnoses today include:  1. Palpitations     Tests performed today include: See side panel of your discharge paperwork for testing performed today. Vital signs are listed at the bottom of these instructions.   Medications prescribed:    Take any prescribed medications only as prescribed, and any over the counter medications only as directed on the packaging.  Home care instructions:  Please follow any educational materials contained in this packet.   Follow-up instructions: Please follow-up with your primary care provider as soon as possible after completing the Holter monitor.  I prescribed a Holter monitor.  You will be contacted within the next couple days by cardiology.  If you do not hear by the end of the week, please call the clinic I listed above to discuss having a Holter monitor placed.  Return instructions:  Please return to the Emergency Department if you experience worsening symptoms.  Please return the emergency department if you develop any episodes of passing out with your symptoms, worsening or persistent palpitations or persistent dizziness or lightheadedness. Please return if you have any other emergent concerns.  Additional Information:   Your vital signs today were: BP 140/90 (BP Location: Right Arm)    Pulse 93    Temp 98.2 F (36.8 C) (Oral)    Resp 18    Ht 5\' 5"  (1.651 m)    Wt 86.2 kg    LMP 06/27/2018    SpO2 100%    BMI 31.62 kg/m  If your blood pressure (BP) was elevated on multiple readings during this visit above 130 for the top number or above 80 for the bottom number, please have this repeated by your primary care provider within one month. --------------  Thank you for allowing [place] to participate in your care today.

## 2018-07-26 NOTE — ED Notes (Signed)
Bed: WA09 Expected date:  Expected time:  Means of arrival:  Comments: 38 yo dizziness/weakness x1 month

## 2018-07-26 NOTE — ED Provider Notes (Signed)
[facility]-EMERGENCY DEPT Provider Note   CSN: [id number] Arrival date & time: 07/26/18  1232    History   Chief Complaint Chief Complaint  Patient presents with  . Dizziness    HPI Kristi Bailey is a 38 y.o. female.     HPI   Patient is a 38 year old female with a history of anemia, currently resolved, sickle cell trait, and vitamin D deficiency presenting for palpitations.  Patient reports that she has had approximately 3 months of generalized malaise, and intermittent lightheadedness.  Today she reports that she was at work and she noticed that her heart rate on her Apple Watch was in the 140s.  Patient reports that she did not feel the palpitations and did not experience chest pain or shortness of breath, however she did sit down due to "not feeling right".  Patient reports some nausea but no vomiting.  Patient reports that this is happened before, and she has follow this on her apple watch.  Patient denies any family history of cardiac disease, sudden cardiac death, or unexplained syncopal episodes.  Patient has never had any syncopal episodes with the sensations.  Patient reports that she stopped caffeine within the last couple weeks due to her symptoms.  Patient has had multiple pregnancy test within the last couple weeks during her work-up, and is on her menses currently denies chance of pregnancy.  Patient recently established care with a primary care provider within the last 2 weeks and had multiple normal laboratory studies including electrolytes, iron profile, CBC, and TSH.  Patient denies any history of DVT/PE, hormone use, cancer treatment, recent immobilization, hospitalization, recent surgery, lower extremity or calf tenderness, cough or hemoptysis.  Past Medical History:  Diagnosis Date  . Anemia   . Sickle cell trait (HCC)     There are no active problems to display for this patient.   History reviewed. No pertinent surgical history.   OB History     No obstetric history on file.      Home Medications    Prior to Admission medications   Medication Sig Start Date End Date Taking? Authorizing Provider  famotidine (PEPCID) 20 MG tablet Take 1 tablet (20 mg total) by mouth 2 (two) times daily. 11/16/13   Vanetta Mulders, MD  folic acid (FOLVITE) 800 MCG tablet Take 1 tablet (800 mcg total) by mouth daily. 02/19/12   Oliveri, Tia L, PA-C  metroNIDAZOLE (FLAGYL) 500 MG tablet Take 1 tablet (500 mg total) by mouth 2 (two) times daily. One po bid x 7 days 01/01/16   Fayrene Helper, PA-C    Family History No family history on file.  Social History Social History   Tobacco Use  . Smoking status: Never Smoker  . Smokeless tobacco: Never Used  Substance Use Topics  . Alcohol use: No  . Drug use: Not on file     Allergies   Patient has no known allergies.   Review of Systems Review of Systems  Constitutional: Negative for chills and fever.  HENT: Negative for congestion and sore throat.   Eyes: Negative for visual disturbance.  Respiratory: Negative for cough, chest tightness and shortness of breath.   Cardiovascular: Positive for palpitations. Negative for chest pain and leg swelling.  Gastrointestinal: Negative for abdominal pain, nausea and vomiting.  Genitourinary: Negative for dysuria and flank pain.  Musculoskeletal: Negative for back pain and myalgias.  Skin: Negative for rash.  Neurological: Positive for light-headedness. Negative for dizziness, syncope and headaches.  Physical Exam Updated Vital Signs BP 140/90 (BP Location: Right Arm)   Pulse 93   Temp 98.2 F (36.8 C) (Oral)   Resp 18   Ht 5\' 5"  (1.651 m)   Wt 86.2 kg   LMP 06/27/2018   SpO2 100%   BMI 31.62 kg/m   Physical Exam Vitals signs and nursing note reviewed.  Constitutional:      General: She is not in acute distress.    Appearance: She is well-developed.  HENT:     Head: Normocephalic and atraumatic.  Eyes:     Conjunctiva/sclera:  Conjunctivae normal.     Pupils: Pupils are equal, round, and reactive to light.  Neck:     Musculoskeletal: Normal range of motion and neck supple.  Cardiovascular:     Rate and Rhythm: Normal rate and regular rhythm.     Heart sounds: S1 normal and S2 normal. No murmur.     Comments: No LE edema bilaterally.  Pulmonary:     Effort: Pulmonary effort is normal.     Breath sounds: Normal breath sounds. No wheezing or rales.  Abdominal:     General: There is no distension.     Palpations: Abdomen is soft.     Tenderness: There is no abdominal tenderness. There is no guarding.  Musculoskeletal: Normal range of motion.        General: No deformity.  Lymphadenopathy:     Cervical: No cervical adenopathy.  Skin:    General: Skin is warm and dry.     Findings: No erythema or rash.  Neurological:     Mental Status: She is alert.     Comments: Cranial nerves grossly intact. Patient moves extremities symmetrically and with good coordination.  Psychiatric:        Behavior: Behavior normal.        Thought Content: Thought content normal.        Judgment: Judgment normal.      ED Treatments / Results  Labs (all labs ordered are listed, but only abnormal results are displayed) Labs Reviewed  CBG MONITORING, ED    EKG EKG Interpretation  Date/Time:  Sunday July 26 2018 12:47:30 EDT Ventricular Rate:  87 PR Interval:    QRS Duration: 100 QT Interval:  385 QTC Calculation: 464 R Axis:   72 Text Interpretation:  Sinus rhythm Atrial premature complexes Prolonged PR interval Confirmed by Zadie Rhine ([id number]) on 07/26/2018 1:18:54 PM   EKG Interpretation  Date/Time:  Sunday July 26 2018 12:47:30 EDT Ventricular Rate:  87 PR Interval:    QRS Duration: 100 QT Interval:  385 QTC Calculation: 464 R Axis:   72 Text Interpretation:  Sinus rhythm Atrial premature complexes Prolonged PR interval Confirmed by Zadie Rhine ([id number]) on 07/26/2018 1:18:54 PM       Radiology No  results found.  Procedures Procedures (including critical care time)  Medications Ordered in ED Medications - No data to display   Initial Impression / Assessment and Plan / ED Course  I have reviewed the triage vital signs and the nursing notes.  Pertinent labs & imaging results that were available during my care of the patient were reviewed by me and considered in my medical decision making (see chart for details).        Patient is nontoxic-appearing, hemodynamically stable, with no evidence of palpitations in the emergency department.  Patient has possible prolonged PR on her EKG, and PACs, but does not appear to have any second-degree heart block.  Repeat EKG to confirm.  Patient CBG is normal.  Patient had normal laboratory studies just on 07-16-2018.  This includes no anemia or electrolyte abnormalities.  Do not feel that this needs to be repeated today given patient's well appearance.  I do feel the patient could benefit from a Holter monitor given the recorded palpitations with a heart rate in the 140s on her apple watch.  Orders are placed for this.  Patient instructed to follow-up with Holter monitoring with cardiology and ultimately her primary care provider.  Repeat EKG demonstrates PACs, but no evidence of secondary heart block.  Return precautions given for any palpitations with syncope, chest pain, shortness of breath, or any worsening dizziness or lightheadedness.  Patient is in understanding and agrees with the plan of care.  Final Clinical Impressions(s) / ED Diagnoses   Final diagnoses:  Palpitations    ED Discharge Orders         Ordered    HOLTER MONITOR - 48 HOUR    Comments:  PCP Dr. Donley [facility]. Please call with results [phone].   07/26/18 1328           Elisha Ponder, PA-C 07/26/18 1347    Zadie Rhine, MD 07/26/18 1433

## 2018-07-26 NOTE — ED Triage Notes (Signed)
Patient here from work with complaints of dizziness x3 months. Reports that she has been seen multiple times for the same.

## 2018-07-29 ENCOUNTER — Emergency Department (HOSPITAL_BASED_OUTPATIENT_CLINIC_OR_DEPARTMENT_OTHER)
Admission: EM | Admit: 2018-07-29 | Discharge: 2018-07-29 | Disposition: A | Discharge status: Home / Self Care | Payer: BLUE CROSS/BLUE SHIELD | Attending: Emergency Medicine | Admitting: Emergency Medicine

## 2018-07-29 ENCOUNTER — Other Ambulatory Visit: Payer: Self-pay

## 2018-07-29 ENCOUNTER — Encounter (HOSPITAL_BASED_OUTPATIENT_CLINIC_OR_DEPARTMENT_OTHER): Payer: Self-pay

## 2018-07-29 DIAGNOSIS — R51 Headache: Secondary | ICD-10-CM | POA: Diagnosis not present

## 2018-07-29 DIAGNOSIS — R002 Palpitations: Secondary | ICD-10-CM | POA: Insufficient documentation

## 2018-07-29 DIAGNOSIS — R42 Dizziness and giddiness: Secondary | ICD-10-CM | POA: Diagnosis not present

## 2018-07-29 DIAGNOSIS — Z79899 Other long term (current) drug therapy: Secondary | ICD-10-CM | POA: Diagnosis not present

## 2018-07-29 NOTE — Discharge Instructions (Addendum)
Keep your cardiology appointment.  As we discussed return for any chest pain that occurs lasting 15 minutes or longer.  Return for an accelerated heart rate 140 beats or greater lasting 40 minutes or longer.  Suspect that cardiology will do home monitoring.  As long as her symptoms remain similar to what is been occurring for the past month okay to wait to follow-up with cardiology later in the week.

## 2018-07-29 NOTE — ED Triage Notes (Addendum)
Pt c/o palpitations started 3/8-was seen at [facility] ED-denies CP, c/o HA and light headed-NAD-steady gait

## 2018-07-30 NOTE — ED Provider Notes (Signed)
[facility] EMERGENCY DEPARTMENT Provider Note   CSN: [id number] Arrival date & time: 07/29/18  1310    History   Chief Complaint Chief Complaint  Patient presents with  . Palpitations    HPI Kristi Bailey is a 38 y.o. female.     Patient was seen March 8 at [facility] long for similar symptoms.  There she was seen for palpitations and dizziness in triage she complained of headache and being lightheaded.  Denied any chest pain.  Patient does have an appointment with cardiology pending.  She was referred in for Holter monitoring.  Patient states is been about a month that she has been feeling these palpitations and feels like her heart gets fast.  She has a apple watch which kind of monitors her heart rate.  And this kind of alerts her.  She is never passed out.  Again no chest pain.     Past Medical History:  Diagnosis Date  . Anemia   . Sickle cell trait (HCC)     There are no active problems to display for this patient.   History reviewed. No pertinent surgical history.   OB History   No obstetric history on file.      Home Medications    Prior to Admission medications   Medication Sig Start Date End Date Taking? Authorizing Provider  Vitamin D, Ergocalciferol, (DRISDOL) 1.25 MG (50000 UT) CAPS capsule Take by mouth. 07/21/18  Yes [provider]  Multiple Vitamin (MULTIVITAMIN) capsule Take by mouth.    [provider]    Family History No family history on file.  Social History Social History   Tobacco Use  . Smoking status: Never Smoker  . Smokeless tobacco: Never Used  Substance Use Topics  . Alcohol use: No  . Drug use: Never     Allergies   Patient has no known allergies.   Review of Systems Review of Systems  Constitutional: Negative for chills and fever.  HENT: Negative for congestion, rhinorrhea and sore throat.   Eyes: Negative for visual disturbance.  Respiratory: Negative for cough and shortness of breath.    Cardiovascular: Positive for palpitations. Negative for chest pain and leg swelling.  Gastrointestinal: Negative for abdominal pain, diarrhea, nausea and vomiting.  Genitourinary: Negative for dysuria.  Musculoskeletal: Negative for back pain and neck pain.  Skin: Negative for rash.  Neurological: Negative for dizziness, light-headedness and headaches.  Hematological: Does not bruise/bleed easily.  Psychiatric/Behavioral: Negative for confusion.     Physical Exam Updated Vital Signs BP 120/72 (BP Location: Right Arm)   Pulse 72   Temp 98.2 F (36.8 C) (Oral)   Resp 16   Ht 1.651 m (5\' 5" )   Wt 84.4 kg   LMP 07/24/2018   SpO2 100%   BMI 30.95 kg/m   Physical Exam Vitals signs and nursing note reviewed.  Constitutional:      General: She is not in acute distress.    Appearance: She is well-developed.  HENT:     Head: Normocephalic and atraumatic.     Mouth/Throat:     Mouth: Mucous membranes are moist.  Eyes:     Extraocular Movements: Extraocular movements intact.     Conjunctiva/sclera: Conjunctivae normal.  Neck:     Musculoskeletal: Normal range of motion and neck supple.  Cardiovascular:     Rate and Rhythm: Normal rate and regular rhythm.     Heart sounds: No murmur.  Pulmonary:     Effort: Pulmonary effort is  normal. No respiratory distress.     Breath sounds: Normal breath sounds.  Abdominal:     Palpations: Abdomen is soft.     Tenderness: There is no abdominal tenderness.  Musculoskeletal: Normal range of motion.        General: No swelling.  Skin:    General: Skin is warm and dry.     Capillary Refill: Capillary refill takes less than 2 seconds.  Neurological:     General: No focal deficit present.     Mental Status: She is alert and oriented to person, place, and time.      ED Treatments / Results  Labs (all labs ordered are listed, but only abnormal results are displayed) Labs Reviewed - No data to display  EKG EKG Interpretation   Date/Time:  Wednesday July 29 2018 13:17:25 EDT Ventricular Rate:  72 PR Interval:  198 QRS Duration: 84 QT Interval:  398 QTC Calculation: 435 R Axis:   84 Text Interpretation:  Sinus rhythm with Blocked Premature atrial complexes Nonspecific T wave abnormality Abnormal ECG No significant change since last tracing Confirmed by Vanetta Mulders [phone]) on 07/29/2018 1:22:41 PM   Radiology No results found.  Procedures Procedures (including critical care time)  Medications Ordered in ED Medications - No data to display   Initial Impression / Assessment and Plan / ED Course  I have reviewed the triage vital signs and the nursing notes.  Pertinent labs & imaging results that were available during my care of the patient were reviewed by me and considered in my medical decision making (see chart for details).        No tachycardia here.  Patient feeling much better.  Does have a bit of a sinus arrhythmia or may be some PACs.  On monitor there may have been some PVCs.  Patient very stable similar presentation on March 8.  Work-up there without any significant problems that day or in the past.  Patient does have follow-up with cardiology.  Precautions given to return for rapid heart rate that lasted 40 minutes or longer.  For the development of any chest pain that last 15 minutes or longer.  Or if she passes out.  Patient stable for discharge home.  Final Clinical Impressions(s) / ED Diagnoses   Final diagnoses:  Heart palpitations    ED Discharge Orders    None       Vanetta Mulders, MD 07/30/18 1726

## 2019-03-23 ENCOUNTER — Ambulatory Visit: Payer: Self-pay | Admitting: Pediatrics

## 2020-05-18 ENCOUNTER — Other Ambulatory Visit: Payer: Self-pay

## 2020-05-18 ENCOUNTER — Emergency Department (HOSPITAL_BASED_OUTPATIENT_CLINIC_OR_DEPARTMENT_OTHER): Payer: BLUE CROSS/BLUE SHIELD

## 2020-05-18 ENCOUNTER — Encounter (HOSPITAL_BASED_OUTPATIENT_CLINIC_OR_DEPARTMENT_OTHER): Payer: Self-pay

## 2020-05-18 ENCOUNTER — Emergency Department (HOSPITAL_BASED_OUTPATIENT_CLINIC_OR_DEPARTMENT_OTHER)
Admission: EM | Admit: 2020-05-18 | Discharge: 2020-05-18 | Disposition: A | Discharge status: Home / Self Care | Payer: BLUE CROSS/BLUE SHIELD | Attending: Emergency Medicine | Admitting: Emergency Medicine

## 2020-05-18 DIAGNOSIS — R0602 Shortness of breath: Secondary | ICD-10-CM | POA: Insufficient documentation

## 2020-05-18 DIAGNOSIS — R002 Palpitations: Secondary | ICD-10-CM | POA: Diagnosis not present

## 2020-05-18 LAB — CBC
HCT: 37.1 % (ref 36.0–46.0)
Hemoglobin: 12.7 g/dL (ref 12.0–15.0)
MCH: 28.3 pg (ref 26.0–34.0)
MCHC: 34.2 g/dL (ref 30.0–36.0)
MCV: 82.6 fL (ref 80.0–100.0)
Platelets: 221 10*3/uL (ref 150–400)
RBC: 4.49 MIL/uL (ref 3.87–5.11)
RDW: 12.9 % (ref 11.5–15.5)
WBC: 4.6 10*3/uL (ref 4.0–10.5)
nRBC: 0 % (ref 0.0–0.2)

## 2020-05-18 LAB — BASIC METABOLIC PANEL
Anion gap: 11 (ref 5–15)
BUN: 9 mg/dL (ref 6–20)
CO2: 24 mmol/L (ref 22–32)
Calcium: 9.2 mg/dL (ref 8.9–10.3)
Chloride: 102 mmol/L (ref 98–111)
Creatinine, Ser: 0.59 mg/dL (ref 0.44–1.00)
GFR, Estimated: >60 mL/min (ref >60)
Glucose, Bld: 102 mg/dL — ABNORMAL HIGH (ref 70–99)
Potassium: 4 mmol/L (ref 3.5–5.1)
Sodium: 137 mmol/L (ref 135–145)

## 2020-05-18 NOTE — Discharge Instructions (Signed)
Work-up here today for the palpitations without any acute abnormalities.  Labs normal chest x-ray normal.  Cardiac monitoring without any arrhythmia.  Follow-up with your cardiologist.  Return for any new or worse symptoms.  Work note provided.

## 2020-05-18 NOTE — ED Triage Notes (Signed)
Pt states she was on her way to work when she felt her heart racing. Pt states she also feels short of breath. Pt states she has been wearing a heart monitor for her symptoms. Pt states her heart still feels as if it is racing.

## 2020-05-18 NOTE — ED Provider Notes (Signed)
[facility] EMERGENCY DEPARTMENT Provider Note   CSN: [id number] Arrival date & time: 05/18/20  6761     History Chief Complaint  Patient presents with  . Palpitations    Kristi Bailey is a 39 y.o. female.  Patient has a loop recorder on and is being followed by cardiology.  Patient was on her way to work today when her heart started to race.  She also felt short of breath.  Currently she feels fine.  There was no chest pain.        Past Medical History:  Diagnosis Date  . Anemia   . Sickle cell trait (HCC)     There are no problems to display for this patient.   History reviewed. No pertinent surgical history.   OB History   No obstetric history on file.     History reviewed. No pertinent family history.  Social History   Tobacco Use  . Smoking status: Never Smoker  . Smokeless tobacco: Never Used  Vaping Use  . Vaping Use: Never used  Substance Use Topics  . Alcohol use: Yes    Comment: occ  . Drug use: Never    Home Medications Prior to Admission medications   Medication Sig Start Date End Date Taking? Authorizing Provider  celecoxib (CELEBREX) 100 MG capsule Take by mouth. 04/26/20  Yes [provider]  cyclobenzaprine (FLEXERIL) 5 MG tablet Take by mouth. 04/24/20  Yes [provider]  Multiple Vitamin (MULTIVITAMIN) capsule Take by mouth.    [provider]  Vitamin D, Ergocalciferol, (DRISDOL) 1.25 MG (50000 UT) CAPS capsule Take by mouth. 07/21/18   [provider]    Allergies    Patient has no known allergies.  Review of Systems   Review of Systems  Constitutional: Negative for chills and fever.  HENT: Negative for rhinorrhea and sore throat.   Eyes: Negative for visual disturbance.  Respiratory: Positive for shortness of breath. Negative for cough.   Cardiovascular: Positive for palpitations. Negative for chest pain and leg swelling.  Gastrointestinal: Negative for abdominal pain, diarrhea,  nausea and vomiting.  Genitourinary: Negative for dysuria.  Musculoskeletal: Negative for back pain and neck pain.  Skin: Negative for rash.  Neurological: Negative for dizziness, light-headedness and headaches.  Hematological: Does not bruise/bleed easily.  Psychiatric/Behavioral: Negative for confusion.    Physical Exam Updated Vital Signs BP 129/72   Pulse 64   Temp 98.2 F (36.8 C) (Oral)   Resp 17   Ht 1.651 m (5\' 5" )   Wt 85.7 kg   LMP 05/18/2020   SpO2 100%   BMI 31.45 kg/m   Physical Exam Vitals and nursing note reviewed.  Constitutional:      General: She is not in acute distress.    Appearance: Normal appearance. She is well-developed and well-nourished.  HENT:     Head: Normocephalic and atraumatic.  Eyes:     Extraocular Movements: Extraocular movements intact.     Conjunctiva/sclera: Conjunctivae normal.     Pupils: Pupils are equal, round, and reactive to light.  Cardiovascular:     Rate and Rhythm: Normal rate and regular rhythm.     Heart sounds: No murmur heard.   Pulmonary:     Effort: Pulmonary effort is normal. No respiratory distress.     Breath sounds: Normal breath sounds.  Abdominal:     Palpations: Abdomen is soft.     Tenderness: There is no abdominal tenderness.  Musculoskeletal:  General: No swelling or edema. Normal range of motion.     Cervical back: Normal range of motion and neck supple.  Skin:    General: Skin is warm and dry.     Capillary Refill: Capillary refill takes less than 2 seconds.  Neurological:     General: No focal deficit present.     Mental Status: She is alert and oriented to person, place, and time.     Cranial Nerves: No cranial nerve deficit.     Sensory: No sensory deficit.     Motor: No weakness.  Psychiatric:        Mood and Affect: Mood and affect normal.     ED Results / Procedures / Treatments   Labs (all labs ordered are listed, but only abnormal results are displayed) Labs Reviewed   BASIC METABOLIC PANEL - Abnormal; Notable for the following components:      Result Value   Glucose, Bld 102 (*)    All other components within normal limits  CBC    EKG EKG Interpretation  Date/Time:  Thursday May 18 2020 07:02:34 EST Ventricular Rate:  92 PR Interval:  196 QRS Duration: 76 QT Interval:  374 QTC Calculation: 462 R Axis:   71 Text Interpretation: Sinus rhythm with Premature atrial complexes Otherwise normal ECG No significant change since last tracing Confirmed by Vanetta Mulders [phone]) on 05/18/2020 7:37:03 AM   Radiology DG Chest Port 1 View  Result Date: 05/18/2020 CLINICAL DATA:  Shortness of breath. EXAM: PORTABLE CHEST 1 VIEW COMPARISON:  November 16, 2013. FINDINGS: The heart size and mediastinal contours are within normal limits. Both lungs are clear. No pneumothorax or pleural effusion is noted. The visualized skeletal structures are unremarkable. IMPRESSION: No active disease. Electronically Signed   By: Lupita Raider M.D.   On: 05/18/2020 09:38    Procedures Procedures (including critical care time)  Medications Ordered in ED Medications - No data to display  ED Course  I have reviewed the triage vital signs and the nursing notes.  Pertinent labs & imaging results that were available during my care of the patient were reviewed by me and considered in my medical decision making (see chart for details).    MDM Rules/Calculators/A&P                           Cardiac monitoring here without any arrhythmias.  Occasionally would drop a beat.  Chest x-ray negative basic labs negative.  Patient feels fine.  Patient stable for discharge follow back up with cardiology for her follow-up of her loop recorder that she has in place.  Work note provided     Final Clinical Impression(s) / ED Diagnoses Final diagnoses:  Palpitations    Rx / DC Orders ED Discharge Orders    None       Vanetta Mulders, MD 05/18/20 [phone]

## 2020-05-18 NOTE — ED Notes (Signed)
PCXR at bedside.

## 2020-05-18 NOTE — ED Notes (Signed)
ED Provider at bedside. 

## 2024-05-16 ENCOUNTER — Encounter (HOSPITAL_BASED_OUTPATIENT_CLINIC_OR_DEPARTMENT_OTHER): Payer: Self-pay | Admitting: Emergency Medicine

## 2024-05-16 ENCOUNTER — Emergency Department (HOSPITAL_BASED_OUTPATIENT_CLINIC_OR_DEPARTMENT_OTHER)
Admission: EM | Admit: 2024-05-16 | Discharge: 2024-05-17 | Disposition: A | Discharge status: Home / Self Care | Payer: PRIVATE HEALTH INSURANCE | Attending: Emergency Medicine | Admitting: Emergency Medicine

## 2024-05-16 ENCOUNTER — Other Ambulatory Visit: Payer: Self-pay

## 2024-05-16 DIAGNOSIS — R002 Palpitations: Secondary | ICD-10-CM | POA: Insufficient documentation

## 2024-05-16 DIAGNOSIS — R000 Tachycardia, unspecified: Secondary | ICD-10-CM | POA: Insufficient documentation

## 2024-05-16 DIAGNOSIS — R519 Headache, unspecified: Secondary | ICD-10-CM | POA: Diagnosis not present

## 2024-05-16 DIAGNOSIS — R079 Chest pain, unspecified: Secondary | ICD-10-CM | POA: Insufficient documentation

## 2024-05-16 LAB — TROPONIN T, HIGH SENSITIVITY: Troponin T High Sensitivity: <15 ng/L (ref 0–19)

## 2024-05-16 LAB — RESP PANEL BY RT-PCR (RSV, FLU A&B, COVID)  RVPGX2
Influenza A by PCR: NEGATIVE
Influenza B by PCR: NEGATIVE
Resp Syncytial Virus by PCR: NEGATIVE
SARS Coronavirus 2 by RT PCR: NEGATIVE

## 2024-05-16 LAB — CBC WITH DIFFERENTIAL/PLATELET
Abs Immature Granulocytes: 0.01 K/uL (ref 0.00–0.07)
Basophils Absolute: 0 K/uL (ref 0.0–0.1)
Basophils Relative: 1 %
Eosinophils Absolute: 0 K/uL (ref 0.0–0.5)
Eosinophils Relative: 1 %
HCT: 34.4 % — ABNORMAL LOW (ref 36.0–46.0)
Hemoglobin: 11.7 g/dL — ABNORMAL LOW (ref 12.0–15.0)
Immature Granulocytes: 0 %
Lymphocytes Relative: 36 %
Lymphs Abs: 2.1 K/uL (ref 0.7–4.0)
MCH: 27.6 pg (ref 26.0–34.0)
MCHC: 34 g/dL (ref 30.0–36.0)
MCV: 81.1 fL (ref 80.0–100.0)
Monocytes Absolute: 0.5 K/uL (ref 0.1–1.0)
Monocytes Relative: 9 %
Neutro Abs: 3.1 K/uL (ref 1.7–7.7)
Neutrophils Relative %: 53 %
Platelets: 213 K/uL (ref 150–400)
RBC: 4.24 MIL/uL (ref 3.87–5.11)
RDW: 12.8 % (ref 11.5–15.5)
WBC: 5.8 K/uL (ref 4.0–10.5)
nRBC: 0 % (ref 0.0–0.2)

## 2024-05-16 LAB — BASIC METABOLIC PANEL WITH GFR
Anion gap: 11 (ref 5–15)
BUN: 12 mg/dL (ref 6–20)
CO2: 24 mmol/L (ref 22–32)
Calcium: 9.4 mg/dL (ref 8.9–10.3)
Chloride: 102 mmol/L (ref 98–111)
Creatinine, Ser: 0.75 mg/dL (ref 0.44–1.00)
GFR, Estimated: >60 mL/min
Glucose, Bld: 102 mg/dL — ABNORMAL HIGH (ref 70–99)
Potassium: 4.3 mmol/L (ref 3.5–5.1)
Sodium: 137 mmol/L (ref 135–145)

## 2024-05-16 MED ORDER — SODIUM CHLORIDE 0.9 % IV BOLUS
1000.0000 mL | Freq: Once | INTRAVENOUS | Status: AC
  Administered 2024-05-16: 1000 mL via INTRAVENOUS

## 2024-05-16 MED ORDER — KETOROLAC TROMETHAMINE 15 MG/ML IJ SOLN
15.0000 mg | Freq: Once | INTRAMUSCULAR | Status: AC
  Administered 2024-05-16: 15 mg via INTRAVENOUS
  Filled 2024-05-16: qty 1

## 2024-05-16 NOTE — ED Triage Notes (Signed)
 Pt c/o palpitations and HA that started on her way to work tonight

## 2024-05-16 NOTE — ED Notes (Signed)
 ..  The patient is A&OX4, ambulatory at d/c with independent steady gait, NAD. Pt verbalized understanding of d/c instructions and follow up care.

## 2024-05-16 NOTE — Discharge Instructions (Addendum)
 Back on caffeine.  Increase your hydration status.  No concerning rhythms on your telemetry.  Have given you a cardiology referral in case the palpitations continue.  Follow-up with your primary care doctor.  Heart enzyme and remainder of the blood work all looked okay.  Return for any emergent symptoms.  Keep taking Tylenol  and ibuprofen  as you need to for headache.

## 2024-05-16 NOTE — ED Provider Notes (Signed)
 " [place] [facility] Provider Note   CSN: [id number] Arrival date & time: 05/16/24  1924     Patient presents with: Palpitations and Headache   Kristi Bailey is a 43 y.o. female.   43 year old female presents today for concern of palpitations, right-sided chest pain and headache.  States Tylenol  has been helping with the headache.  Has some right-sided chest wall pain that radiated to her back and then up to her neck.  This is not constant, it is not pleuritic.  She is without any shortness of breath.  No recent long travel, no recent surgery.  Not on any birth control.  Denies history of heart disease.  No significant family history of heart disease.  However she is still worried and would like cardiac enzymes.  The history is provided by the patient. No language interpreter was used.       Prior to Admission medications  Medication Sig Start Date End Date Taking? Authorizing Provider  celecoxib (CELEBREX) 100 MG capsule Take by mouth. 04/26/20   [provider]  cyclobenzaprine (FLEXERIL) 5 MG tablet Take by mouth. 04/24/20   [provider]  Multiple Vitamin (MULTIVITAMIN) capsule Take by mouth.    [provider]  Vitamin D, Ergocalciferol, (DRISDOL) 1.25 MG (50000 UT) CAPS capsule Take by mouth. 07/21/18   [provider]    Allergies: Patient has no known allergies.    Review of Systems  Constitutional:  Negative for chills and fever.  Respiratory:  Negative for shortness of breath.   Cardiovascular:  Positive for chest pain and palpitations. Negative for leg swelling.  Neurological:  Negative for light-headedness.  All other systems reviewed and are negative.   Updated Vital Signs BP 135/77   Pulse 97   Temp 98.9 F (37.2 C)   Resp (!) 21   Ht 5' 5 (1.651 m)   Wt 81.6 kg   SpO2 100%   BMI 29.95 kg/m   Physical Exam Vitals and nursing note reviewed.  Constitutional:      General: She is not  in acute distress.    Appearance: Normal appearance. She is not ill-appearing.  HENT:     Head: Normocephalic and atraumatic.     Nose: Nose normal.  Eyes:     Conjunctiva/sclera: Conjunctivae normal.  Cardiovascular:     Rate and Rhythm: Regular rhythm. Tachycardia present.  Pulmonary:     Effort: Pulmonary effort is normal. No respiratory distress.  Musculoskeletal:        General: No deformity. Normal range of motion.     Cervical back: Normal range of motion.  Skin:    Findings: No rash.  Neurological:     Mental Status: She is alert.     (all labs ordered are listed, but only abnormal results are displayed) Labs Reviewed  RESP PANEL BY RT-PCR (RSV, FLU A&B, COVID)  RVPGX2  CBC WITH DIFFERENTIAL/PLATELET  BASIC METABOLIC PANEL WITH GFR  TROPONIN T, HIGH SENSITIVITY    EKG: None  Radiology: No results found.   Procedures   Medications Ordered in the ED  sodium chloride  0.9 % bolus 1,000 mL (has no administration in time range)  ketorolac  (TORADOL ) 15 MG/ML injection 15 mg (has no administration in time range)                                    Medical Decision Making  Amount and/or Complexity of Data Reviewed Labs: ordered.  Risk Prescription drug management.   Medical Decision Making / ED Course   This patient presents to the ED for concern of palpitations, chest pain, headache, this involves an extensive number of treatment options, and is a complaint that carries with it a high risk of complications and morbidity.  The differential diagnosis includes viral URI, ACS, MSK pain, pneumonia, PVCs  MDM: 43 year old female presents today for concern of above-mentioned complaints. She is overall well-appearing. She does have bouts of tachycardia. Currently no chest pain. Telemetry shows periods of brief pauses otherwise normal sinus rhythm.  Episodes of palpitations correlate with these pauses. Patient would like some blood work.  Will provide fluids as  well.  Feels improved after fluids and Toradol . Will discharge.  Will give cardiology referral in case the palpitations continued given her sinus pauses that we saw however these were very brief. Patient discussed with attending.  Discharged in stable condition.   Lab Tests: -I ordered, reviewed, and interpreted labs.   The pertinent results include:   Labs Reviewed  RESP PANEL BY RT-PCR (RSV, FLU A&B, COVID)  RVPGX2  CBC WITH DIFFERENTIAL/PLATELET  BASIC METABOLIC PANEL WITH GFR  TROPONIN T, HIGH SENSITIVITY      EKG  EKG Interpretation Date/Time:    Ventricular Rate:    PR Interval:    QRS Duration:    QT Interval:    QTC Calculation:   R Axis:      Text Interpretation:           Medicines ordered and prescription drug management: Meds ordered this encounter  Medications   sodium chloride  0.9 % bolus 1,000 mL   ketorolac  (TORADOL ) 15 MG/ML injection 15 mg    -I have reviewed the patients home medicines and have made adjustments as needed  Reevaluation: After the interventions noted above, I reevaluated the patient and found that they have :improved  Co morbidities that complicate the patient evaluation  Past Medical History:  Diagnosis Date   Anemia    Sickle cell trait       Dispostion: Discharged in stable condition.  Return precaution discussed.  Patient voices understanding and is in agreement with plan.  Final diagnoses:  Palpitations    ED Discharge Orders          Ordered    Ambulatory referral to Cardiology       Comments: If you have not heard from the Cardiology office within the next 72 hours please call [phone].   05/16/24 2332               Hildegard Loge, PA-C 05/16/24 2333    Patt Alm Macho, MD 05/16/24 [phone]  "

## 2024-05-29 NOTE — Progress Notes (Unsigned)
 "  Cardiology [facility]:    Date:  06/01/2024   ID:  Kristi Bailey, DOB 24-Dec-1980, MRN [id number]  PCP:  No primary care provider on file.  Cardiologist:  None     Referring MD: Hildegard Loge, PA-C   Chief Complaint: palpitations  History of Present Illness:    Kristi Bailey is a 44 y.o. female with a history of palpitations with PACs/ PVCs noted on monitor in 2020, iron deficiency anemia, vitamin D deficiency, and sickle cell trait who presents today as a new patient in the [facility] for further evaluation of palpitations.   Patient has a history of palpitations. She was previously seen by Cardiology at [facility] for this. Monitor in 09/2018 showed occasional PACs and rare PVCs but no significant arrhythmias. Echo in 02/2019 showed LVEF of 65-70% with no significant valvular disease. Repeat monitor was ordered in 04/2020 but I am unable to see the results of this. Per review of prior Cardiology notes, she was also presumed to likely have paroxysmal SVT.   She was recently seen in the ED on 05/16/2024 for palpitations and headaches. She also reported some right sided chest wall pain. EKG showed sinus tachycardia, rate 116 bpm, with Q waves in V3-V6 and non-specific T wave changes. High-sensitivity troponin was negative. She was reportedly noted to have some sinus pauses on telemetry. She was treated with IV fluids and Toradol  and felt to be stable for discharge. She was referred to Cardiology as an outpatient for further evaluation of palpitations.   Patient states she started having recurrent palpitations last month. She describes these as skipped beat but will also have very brief episodes where it feels like her heart goes so fast. This only last a couple of seconds and then resolves. She stopped drinking caffeine and stopped using Nicotine gum (she does not smoke and was using this because she was told it could help with fatigue). Her palpitations have resolved with  this. She has not had any recurrent palpitations. No other cardiac complaints.   She denies any history of tobacco use. She occasionally drinks wine but no heavy alcohol use. No recreational drug use.    She denies any family history of cardiovascular disease.   ROS: No chest pain, shortness of breath, orthopnea, PND, lower extremity edema, lightheadedness, dizziness, syncope.   EKGs/Labs/Other Studies Reviewed:    The following studies were reviewed today:  Holter Monitor 09/2018 ([facility]): Indication: palpitations Baseline Rhythm: sinus rhythm, with  First degree AVB   Rhythm Findings:  1.  Ventricular: rare PVCs 2.  Supraventricular: occasional PACs 3.  Bradyarrhythmias and Pauses: none, some blocked 4.  Symptoms reported:   None   Conclusions: 1. Unremarkable Holter monitor.  2.  Mild arrhythmia as described  3.  Symptoms were not reported  _______________  Echocardiogram 03/10/2019: Summary: There is no prior echocardiogram for comparison.  Ejection fraction is visually estimated at 65-70%.  Normal left ventricular size and systolic function with no appreciable  segmental abnormality.  Diastolic function appears normal.   EKG:  EKG  ordered today.   EKG Interpretation Date/Time:  Tuesday June 01 2024 10:20:23 EST Ventricular Rate:  64 PR Interval:  218 QRS Duration:  76 QT Interval:  404 QTC Calculation: 416 R Axis:   55  Text Interpretation: Sinus rhythm with 1st degree A-V block No acute ischemic changes No signifcant changes compared to prior tracings Confirmed by Lasheika Ortloff [phone]) on 06/01/2024 10:23:28 AM  Recent Labs: 05/16/2024: BUN 12; Creatinine, Ser 0.75; Hemoglobin 11.7; Platelets 213; Potassium 4.3; Sodium 137  Recent Lipid Panel No results found for: CHOL, TRIG, HDL, CHOLHDL, VLDL, LDLCALC, LDLDIRECT  Physical Exam:    Vital Signs: BP 118/78   Pulse 64   Ht 5' 5.5 (1.664 m)   Wt 186 lb (84.4 kg)    SpO2 98%   BMI 30.48 kg/m     Wt Readings from Last 3 Encounters:  06/01/24 186 lb (84.4 kg)  05/16/24 180 lb (81.6 kg)  05/18/20 189 lb (85.7 kg)     General: 44 y.o. female in no acute distress. HEENT: Normocephalic and atraumatic. Sclera clear.  Neck: Supple. No carotid bruits. No JVD. Heart: RRR. Distinct S1 and S2. No murmurs, gallops, or rubs.  Lungs: No increased work of breathing. Clear to ausculation bilaterally. No wheezes, rhonchi, or rales.  Extremities: No lower extremity edema.   Skin: Warm and dry. Neuro: No focal deficits. Psych: Normal affect. Responds appropriately.  Assessment:    1. Palpitations     Plan:    Palpitations Patient has a history of palpitations with PACs/ PVCs noted on monitor in 2020. She was also presumed to have paroxysmal SVT. She was recently seen in the ED in 04/2024 for palpitations. EKG showed sinus tachycardia, rate 116 bpm, with Q waves in V3-V6 and non-specific T wave changes. High-sensitivity troponin was negative. She was reportedly noted to have some sinus pauses on telemetry. - Palpitations resolved after she stopped drinking caffeine and stopped using Nicotine gum.  - EKG today shows normal sinus rhythm with rate of 64 bpm.  - Symptoms sound most like premature beats. Given symptoms have now resolved, no additional work-up necessary at this time. Advised patient to let us  know if she has recurrent palpitations and can consider a repeat Zio monitor at that time.   Disposition: Follow up as needed.    Signed, Aline FORBES Door, PA-C  06/01/2024 12:03 PM    [facility] "

## 2024-06-01 ENCOUNTER — Ambulatory Visit: Attending: Student | Admitting: Student

## 2024-06-01 ENCOUNTER — Encounter: Payer: Self-pay | Admitting: Student

## 2024-06-01 VITALS — BP 118/78 | HR 64 | Ht 65.5 in | Wt 186.0 lb

## 2024-06-01 DIAGNOSIS — R002 Palpitations: Secondary | ICD-10-CM | POA: Diagnosis not present

## 2024-06-01 NOTE — Patient Instructions (Signed)
 Medication Instructions:  Your physician recommends that you continue on your current medications as directed. Please refer to the Current Medication list given to you today.  *If you need a refill on your cardiac medications before your next appointment, please call your pharmacy*  Lab Work: None ordered  If you have labs (blood work) drawn today and your tests are completely normal, you will receive your results only by: MyChart Message (if you have MyChart) OR A paper copy in the mail If you have any lab test that is abnormal or we need to change your treatment, we will call you to review the results.  Testing/Procedures: None ordered  Follow-Up: At [facility], you and your health needs are our priority.  As part of our continuing mission to provide you with exceptional heart care, our providers are all part of one team.  This team includes your primary Cardiologist (physician) and Advanced Practice Providers or APPs (Physician Assistants and Nurse Practitioners) who all work together to provide you with the care you need, when you need it.  Your next appointment:   As needed   Provider:   Callie Goodrich, PA-C          We recommend signing up for the patient portal called MyChart.  Sign up information is provided on this After Visit Summary.  MyChart is used to connect with patients for Virtual Visits (Telemedicine).  Patients are able to view lab/test results, encounter notes, upcoming appointments, etc.  Non-urgent messages can be sent to your provider as well.   To learn more about what you can do with MyChart, go to [email or web address].   Other Instructions
